# Patient Record
Sex: Male | Born: 1950 | Race: White | Hispanic: No | Marital: Married | Smoking: Former smoker
Health system: Southern US, Community
[De-identification: ages and names within clinical notes are randomized; demographics above are authoritative.]

## PROBLEM LIST (undated history)

## (undated) DIAGNOSIS — F4310 Post-traumatic stress disorder, unspecified: Secondary | ICD-10-CM

## (undated) DIAGNOSIS — G43909 Migraine, unspecified, not intractable, without status migrainosus: Secondary | ICD-10-CM

## (undated) DIAGNOSIS — I639 Cerebral infarction, unspecified: Secondary | ICD-10-CM

## (undated) DIAGNOSIS — G250 Essential tremor: Secondary | ICD-10-CM

## (undated) DIAGNOSIS — G4730 Sleep apnea, unspecified: Secondary | ICD-10-CM

## (undated) DIAGNOSIS — F329 Major depressive disorder, single episode, unspecified: Secondary | ICD-10-CM

## (undated) DIAGNOSIS — E119 Type 2 diabetes mellitus without complications: Secondary | ICD-10-CM

## (undated) DIAGNOSIS — I10 Essential (primary) hypertension: Secondary | ICD-10-CM

## (undated) DIAGNOSIS — I2510 Atherosclerotic heart disease of native coronary artery without angina pectoris: Secondary | ICD-10-CM

## (undated) DIAGNOSIS — F32A Depression, unspecified: Secondary | ICD-10-CM

## (undated) HISTORY — DX: Post-traumatic stress disorder, unspecified: F43.10

## (undated) HISTORY — DX: Atherosclerotic heart disease of native coronary artery without angina pectoris: I25.10

## (undated) HISTORY — DX: Essential (primary) hypertension: I10

## (undated) HISTORY — DX: Sleep apnea, unspecified: G47.30

## (undated) HISTORY — PX: CORONARY ANGIOPLASTY WITH STENT PLACEMENT: SHX49

## (undated) HISTORY — DX: Depression, unspecified: F32.A

## (undated) HISTORY — DX: Migraine, unspecified, not intractable, without status migrainosus: G43.909

## (undated) HISTORY — DX: Cerebral infarction, unspecified: I63.9

## (undated) HISTORY — PX: HERNIA REPAIR: SHX51

## (undated) HISTORY — DX: Essential tremor: G25.0

## (undated) HISTORY — DX: Major depressive disorder, single episode, unspecified: F32.9

---

## 2004-07-16 ENCOUNTER — Emergency Department (HOSPITAL_COMMUNITY): Admission: EM | Admit: 2004-07-16 | Discharge: 2004-07-17 | Payer: Self-pay | Admitting: Emergency Medicine

## 2005-11-13 ENCOUNTER — Other Ambulatory Visit: Payer: Self-pay

## 2005-11-13 ENCOUNTER — Inpatient Hospital Stay: Payer: Self-pay | Admitting: Internal Medicine

## 2005-11-24 ENCOUNTER — Ambulatory Visit: Payer: Self-pay | Admitting: Cardiology

## 2005-11-25 ENCOUNTER — Inpatient Hospital Stay (HOSPITAL_COMMUNITY): Admission: EM | Admit: 2005-11-25 | Discharge: 2005-11-27 | Payer: Self-pay | Admitting: Emergency Medicine

## 2006-03-22 ENCOUNTER — Ambulatory Visit (HOSPITAL_COMMUNITY): Admission: RE | Admit: 2006-03-22 | Discharge: 2006-03-22 | Payer: Self-pay | Admitting: Cardiovascular Disease

## 2006-05-11 ENCOUNTER — Inpatient Hospital Stay (HOSPITAL_COMMUNITY): Admission: EM | Admit: 2006-05-11 | Discharge: 2006-05-13 | Payer: Self-pay | Admitting: *Deleted

## 2009-12-08 ENCOUNTER — Inpatient Hospital Stay: Payer: Self-pay | Admitting: Internal Medicine

## 2015-07-16 ENCOUNTER — Encounter: Payer: Self-pay | Admitting: Emergency Medicine

## 2015-07-16 ENCOUNTER — Emergency Department
Admission: EM | Admit: 2015-07-16 | Discharge: 2015-07-16 | Disposition: A | Discharge status: Home / Self Care | Payer: TRICARE For Life (TFL) | Attending: Emergency Medicine | Admitting: Emergency Medicine

## 2015-07-16 ENCOUNTER — Emergency Department: Payer: TRICARE For Life (TFL)

## 2015-07-16 DIAGNOSIS — R200 Anesthesia of skin: Secondary | ICD-10-CM | POA: Diagnosis present

## 2015-07-16 DIAGNOSIS — Z87891 Personal history of nicotine dependence: Secondary | ICD-10-CM | POA: Diagnosis not present

## 2015-07-16 DIAGNOSIS — E119 Type 2 diabetes mellitus without complications: Secondary | ICD-10-CM | POA: Diagnosis not present

## 2015-07-16 DIAGNOSIS — G459 Transient cerebral ischemic attack, unspecified: Secondary | ICD-10-CM | POA: Diagnosis not present

## 2015-07-16 HISTORY — DX: Type 2 diabetes mellitus without complications: E11.9

## 2015-07-16 LAB — COMPREHENSIVE METABOLIC PANEL
ALK PHOS: 74 U/L (ref 38–126)
ALT: 16 U/L — AB (ref 17–63)
AST: 23 U/L (ref 15–41)
Albumin: 4 g/dL (ref 3.5–5.0)
Anion gap: 6 (ref 5–15)
BILIRUBIN TOTAL: 0.5 mg/dL (ref 0.3–1.2)
BUN: 14 mg/dL (ref 6–20)
CALCIUM: 9.1 mg/dL (ref 8.9–10.3)
CO2: 28 mmol/L (ref 22–32)
CREATININE: 1.32 mg/dL — AB (ref 0.61–1.24)
Chloride: 107 mmol/L (ref 101–111)
GFR calc non Af Amer: 56 mL/min — ABNORMAL LOW (ref >60)
GLUCOSE: 136 mg/dL — AB (ref 65–99)
Potassium: 3.6 mmol/L (ref 3.5–5.1)
SODIUM: 141 mmol/L (ref 135–145)
TOTAL PROTEIN: 7.4 g/dL (ref 6.5–8.1)

## 2015-07-16 LAB — PROTIME-INR
INR: 1.12
PROTHROMBIN TIME: 14.6 s (ref 11.4–15.0)

## 2015-07-16 LAB — CBC
HCT: 41.5 % (ref 40.0–52.0)
Hemoglobin: 13.9 g/dL (ref 13.0–18.0)
MCH: 30.1 pg (ref 26.0–34.0)
MCHC: 33.5 g/dL (ref 32.0–36.0)
MCV: 90 fL (ref 80.0–100.0)
PLATELETS: 168 10*3/uL (ref 150–440)
RBC: 4.61 MIL/uL (ref 4.40–5.90)
RDW: 13.9 % (ref 11.5–14.5)
WBC: 6.3 10*3/uL (ref 3.8–10.6)

## 2015-07-16 LAB — APTT: APTT: 32 s (ref 24–36)

## 2015-07-16 LAB — DIFFERENTIAL
Basophils Absolute: 0 10*3/uL (ref 0–0.1)
Basophils Relative: 1 %
Eosinophils Absolute: 0.2 10*3/uL (ref 0–0.7)
Eosinophils Relative: 3 %
LYMPHS ABS: 1.8 10*3/uL (ref 1.0–3.6)
LYMPHS PCT: 29 %
MONO ABS: 0.3 10*3/uL (ref 0.2–1.0)
Monocytes Relative: 5 %
NEUTROS ABS: 3.9 10*3/uL (ref 1.4–6.5)
Neutrophils Relative %: 62 %

## 2015-07-16 MED ORDER — ASPIRIN 81 MG PO CHEW
324.0000 mg | CHEWABLE_TABLET | Freq: Once | ORAL | Status: AC
  Administered 2015-07-16: 324 mg via ORAL
  Filled 2015-07-16: qty 4

## 2015-07-16 MED ORDER — IOHEXOL 350 MG/ML SOLN
75.0000 mL | Freq: Once | INTRAVENOUS | Status: AC | PRN
  Administered 2015-07-16: 75 mL via INTRAVENOUS

## 2015-07-16 NOTE — ED Notes (Signed)
cbg 129 

## 2015-07-16 NOTE — Discharge Instructions (Signed)
It is very important he follow-up with Dr. Judithann Sheen on Wednesday as planned. Please take a baby aspirin (81 mg) daily in addition to Plavix.  Transient Ischemic Attack A transient ischemic attack (TIA) is a "warning stroke" that causes stroke-like symptoms. Unlike a stroke, a TIA does not cause permanent damage to the brain. The symptoms of a TIA can happen very fast and do not last long. It is important to know the symptoms of a TIA and what to do. This can help prevent a major stroke or death. CAUSES   A TIA is caused by a temporary blockage in an artery in the brain or neck (carotid artery). The blockage does not allow the brain to get the blood supply it needs and can cause different symptoms. The blockage can be caused by either:  A blood clot.  Fatty buildup (plaque) in a neck or brain artery. RISK FACTORS  High blood pressure (hypertension).  High cholesterol.  Diabetes mellitus.  Heart disease.  The build up of plaque in the blood vessels (peripheral artery disease or atherosclerosis).  The build up of plaque in the blood vessels providing blood and oxygen to the brain (carotid artery stenosis).  An abnormal heart rhythm (atrial fibrillation).  Obesity.  Smoking.  Taking oral contraceptives (especially in combination with smoking).  Physical inactivity.  A diet high in fats, salt (sodium), and calories.  Alcohol use.  Use of illegal drugs (especially cocaine and methamphetamine).  Being male.  Being African American.  Being over the age of 66.  Family history of stroke.  Previous history of blood clots, stroke, TIA, or heart attack.  Sickle cell disease. SYMPTOMS  TIA symptoms are the same as a stroke but are temporary. These symptoms usually develop suddenly, or may be newly present upon awakening from sleep:  Sudden weakness or numbness of the face, arm, or leg, especially on one side of the body.  Sudden trouble walking or difficulty moving arms or  legs.  Sudden confusion.  Sudden personality changes.  Trouble speaking (aphasia) or understanding.  Difficulty swallowing.  Sudden trouble seeing in one or both eyes.  Double vision.  Dizziness.  Loss of balance or coordination.  Sudden severe headache with no known cause.  Trouble reading or writing.  Loss of bowel or bladder control.  Loss of consciousness. DIAGNOSIS  Your caregiver may be able to determine the presence or absence of a TIA based on your symptoms, history, and physical exam. Computed tomography (CT scan) of the brain is usually performed to help identify a TIA. Other tests may be done to diagnose a TIA. These tests may include:  Electrocardiography.  Continuous heart monitoring.  Echocardiography.  Carotid ultrasonography.  Magnetic resonance imaging (MRI).  A scan of the brain circulation.  Blood tests. PREVENTION  The risk of a TIA can be decreased by appropriately treating high blood pressure, high cholesterol, diabetes, heart disease, and obesity and by quitting smoking, limiting alcohol, and staying physically active. TREATMENT  Time is of the essence. Since the symptoms of TIA are the same as a stroke, it is important to seek treatment as soon as possible because you may need a medicine to dissolve the clot (thrombolytic) that cannot be given if too much time has passed. Treatment options vary. Treatment options may include rest, oxygen, intravenous (IV) fluids, and medicines to thin the blood (anticoagulants). Medicines and diet may be used to address diabetes, high blood pressure, and other risk factors. Measures will be taken to prevent  short-term and long-term complications, including infection from breathing foreign material into the lungs (aspiration pneumonia), blood clots in the legs, and falls. Treatment options include procedures to either remove plaque in the carotid arteries or dilate carotid arteries that have narrowed due to plaque.  Those procedures are:  Carotid endarterectomy.  Carotid angioplasty and stenting. HOME CARE INSTRUCTIONS   Take all medicines prescribed by your caregiver. Follow the directions carefully. Medicines may be used to control risk factors for a stroke. Be sure you understand all your medicine instructions.  You may be told to take aspirin or the anticoagulant warfarin. Warfarin needs to be taken exactly as instructed.  Taking too much or too little warfarin is dangerous. Too much warfarin increases the risk of bleeding. Too little warfarin continues to allow the risk for blood clots. While taking warfarin, you will need to have regular blood tests to measure your blood clotting time. A PT blood test measures how long it takes for blood to clot. Your PT is used to calculate another value called an INR. Your PT and INR help your caregiver to adjust your dose of warfarin. The dose can change for many reasons. It is critically important that you take warfarin exactly as prescribed.  Many foods, especially foods high in vitamin K can interfere with warfarin and affect the PT and INR. Foods high in vitamin K include spinach, kale, broccoli, cabbage, collard and turnip greens, brussels sprouts, peas, cauliflower, seaweed, and parsley as well as beef and pork liver, green tea, and soybean oil. You should eat a consistent amount of foods high in vitamin K. Avoid major changes in your diet, or notify your caregiver before changing your diet. Arrange a visit with a dietitian to answer your questions.  Many medicines can interfere with warfarin and affect the PT and INR. You must tell your caregiver about any and all medicines you take, this includes all vitamins and supplements. Be especially cautious with aspirin and anti-inflammatory medicines. Do not take or discontinue any prescribed or over-the-counter medicine except on the advice of your caregiver or pharmacist.  Warfarin can have side effects, such as  excessive bruising or bleeding. You will need to hold pressure over cuts for longer than usual. Your caregiver or pharmacist will discuss other potential side effects.  Avoid sports or activities that may cause injury or bleeding.  Be mindful when shaving, flossing your teeth, or handling sharp objects.  Alcohol can change the body's ability to handle warfarin. It is best to avoid alcoholic drinks or consume only very small amounts while taking warfarin. Notify your caregiver if you change your alcohol intake.  Notify your dentist or other caregivers before procedures.  Eat a diet that includes 5 or more servings of fruits and vegetables each day. This may reduce the risk of stroke. Certain diets may be prescribed to address high blood pressure, high cholesterol, diabetes, or obesity.  A low-sodium, low-saturated fat, low-trans fat, low-cholesterol diet is recommended to manage high blood pressure.  A low-saturated fat, low-trans fat, low-cholesterol, and high-fiber diet may control cholesterol levels.  A controlled-carbohydrate, controlled-sugar diet is recommended to manage diabetes.  A reduced-calorie, low-sodium, low-saturated fat, low-trans fat, low-cholesterol diet is recommended to manage obesity.  Maintain a healthy weight.  Stay physically active. It is recommended that you get at least 30 minutes of activity on most or all days.  Do not smoke.  Limit alcohol use even if you are not taking warfarin. Moderate alcohol use is considered to  be:  No more than 2 drinks each day for men.  No more than 1 drink each day for nonpregnant women.  Stop drug abuse.  Home safety. A safe home environment is important to reduce the risk of falls. Your caregiver may arrange for specialists to evaluate your home. Having grab bars in the bedroom and bathroom is often important. Your caregiver may arrange for equipment to be used at home, such as raised toilets and a seat for the  shower.  Follow all instructions for follow-up with your caregiver. This is very important. This includes any referrals and lab tests. Proper follow up can prevent a stroke or another TIA from occurring. SEEK MEDICAL CARE IF:  You have personality changes.  You have difficulty swallowing.  You are seeing double.  You have dizziness.  You have a fever.  You have skin breakdown. SEEK IMMEDIATE MEDICAL CARE IF:  Any of these symptoms may represent a serious problem that is an emergency. Do not wait to see if the symptoms will go away. Get medical help right away. Call your local emergency services (911 in [place] Do not drive yourself to the hospital.  You have sudden weakness or numbness of the face, arm, or leg, especially on one side of the body.  You have sudden trouble walking or difficulty moving arms or legs.  You have sudden confusion.  You have trouble speaking (aphasia) or understanding.  You have sudden trouble seeing in one or both eyes.  You have a loss of balance or coordination.  You have a sudden, severe headache with no known cause.  You have new chest pain or an irregular heartbeat.  You have a partial or total loss of consciousness. MAKE SURE YOU:   Understand these instructions.  Will watch your condition.  Will get help right away if you are not doing well or get worse. Document Released: 08/08/2005 Document Revised: 11/03/2013 Document Reviewed: 02/03/2014 [facility] Patient Information 2015 [place], [place]. This information is not intended to replace advice given to you by your health care provider. Make sure you discuss any questions you have with your health care provider.

## 2015-07-16 NOTE — ED Notes (Signed)
Pt reports right sided facial numbness that started around 1400 today. Pt also reports "itching" sensation to right arm. Pt taken to CT. Family hx of CVA, pt with hx of cigarette smoking.

## 2015-07-16 NOTE — ED Provider Notes (Signed)
[facility] Emergency Department Provider Note REMINDER - THIS NOTE IS NOT A FINAL MEDICAL RECORD UNTIL IT IS SIGNED. UNTIL THEN, THE CONTENT BELOW MAY REFLECT INFORMATION FROM A DOCUMENTATION TEMPLATE, NOT THE ACTUAL PATIENT VISIT. ____________________________________________  Time seen: Approximately 4:03 PM  I have reviewed the triage vital signs and the nursing notes.   HISTORY  Chief Complaint Numbness    HPI Mitchell Kennedy is a 64 y.o. male history of a previous MI as well as diabetes. Patient reports that approximately 2:00 today he began feeling a tingling itching feeling over the right side of his lower face and jaw. He also had a mild left-sided headache. This came briefly,and then went away after 15 minutes. Came back again just before he got to the emergency room, and he now states that his symptoms have gone gone away.  No trouble speaking. No facial droop. No weakness in arm or leg. No numbness or tingling anywhere else except possibly a very brief feeling of a burning sensation in the right arm  No chest pain or trouble breathing.   Past Medical History  Diagnosis Date  . Diabetes mellitus without complication     There are no active problems to display for this patient.   No past surgical history on file.  No current outpatient prescriptions on file.  Allergies Review of patient's allergies indicates no known allergies.  No family history on file.  Social History Social History  Substance Use Topics  . Smoking status: Former Smoker    Types: Cigarettes  . Smokeless tobacco: None  . Alcohol Use: No    Review of Systems Constitutional: No fever/chills Eyes: No visual changes. ENT: No sore throat. Cardiovascular: Denies chest pain. Respiratory: Denies shortness of breath. Gastrointestinal: No abdominal pain.  No nausea, no vomiting.  No diarrhea.  No constipation. Genitourinary: Negative for dysuria. Musculoskeletal:  Negative for back pain. Skin: Negative for rash. Neurological: Negative for headaches, focal weakness.  10-point ROS otherwise negative.  ____________________________________________   PHYSICAL EXAM:  VITAL SIGNS: ED Triage Vitals  Enc Vitals Group     BP 07/16/15 1441 140/78 mmHg     Pulse Rate 07/16/15 1441 55     Resp 07/16/15 1441 16     Temp 07/16/15 1550 98 F (36.7 C)     Temp Source 07/16/15 1557 Oral     SpO2 07/16/15 1441 100 %     Weight 07/16/15 1441 187 lb 4.8 oz (84.959 kg)     Height --      Head Cir --      Peak Flow --      Pain Score 07/16/15 1441 0     Pain Loc --      Pain Edu? --      Excl. in GC? --    Constitutional: Alert and oriented. Well appearing and in no acute distress. Eyes: Conjunctivae are normal. PERRL. EOMI. Head: Atraumatic. Nose: No congestion/rhinnorhea. Mouth/Throat: Mucous membranes are moist.  Oropharynx non-erythematous. Neck: No stridor.   Cardiovascular: Normal rate, regular rhythm. Grossly normal heart sounds.  Good peripheral circulation. Respiratory: Normal respiratory effort.  No retractions. Lungs CTAB. Gastrointestinal: Soft and nontender. No distention. No abdominal bruits. No CVA tenderness. Musculoskeletal: No lower extremity tenderness nor edema.  No joint effusions. Neurologic:  Normal speech and language. No gross focal neurologic deficits are appreciated. No gait instability. I performed a complete NIH stroke scale, and the patient score is 0. No deficits. Skin:  Skin is  warm, dry and intact. No rash noted. Psychiatric: Mood and affect are normal. Speech and behavior are normal.  ____________________________________________   LABS (all labs ordered are listed, but only abnormal results are displayed)  Labs Reviewed  COMPREHENSIVE METABOLIC PANEL - Abnormal; Notable for the following:    Glucose, Bld 136 (*)    Creatinine, Ser 1.32 (*)    ALT 16 (*)    GFR calc non Af Amer 56 (*)    All other components  within normal limits  PROTIME-INR  APTT  CBC  DIFFERENTIAL  CBG MONITORING, ED  I-STAT TROPOININ, ED  I-STAT CHEM 8, ED   ____________________________________________  EKG  ED ECG REPORT I, QUALE, MARK, the attending physician, personally viewed and interpreted this ECG.  Date: 07/16/2015 EKG Time: 1450 Rate: 50 Rhythm: normal sinus rhythm QRS Axis: normal Intervals: normal ST/T Wave abnormalities: normal Conduction Disutrbances: none Narrative Interpretation: Sinus bradycardia without ischemic change  ____________________________________________  RADIOLOGY   IMPRESSION: Negative CT of the head  Negative CTA of the head and neck. ____________________________________________   PROCEDURES  Procedure(s) performed: None  Critical Care performed: Yes, see critical care note(s)  CRITICAL CARE Performed by: Sharyn Creamer   Total critical care time: 35  Critical care time was exclusive of separately billable procedures and treating other patients.  Critical care was necessary to treat or prevent imminent or life-threatening deterioration.  Critical care was time spent personally by me on the following activities: development of treatment plan with patient and/or surrogate as well as nursing, discussions with consultants, evaluation of patient's response to treatment, examination of patient, obtaining history from patient or surrogate, ordering and performing treatments and interventions, ordering and review of laboratory studies, ordering and review of radiographic studies, pulse oximetry and re-evaluation of patient's condition.  ____________________________________________   INITIAL IMPRESSION / ASSESSMENT AND PLAN / ED COURSE  Pertinent labs & imaging results that were available during my care of the patient were reviewed by me and considered in my medical decision making (see chart for details).  Patient presents with sudden onset of numbness over the right  lower face. This has now resolved. Patient's NIH score is 0. His blood sugar was checked and normal. No acute cardiopulmonary symptoms and a reassuring exam at this time. Based on his previous medical history, primary concern is for possible transient ischemic attack. Patient was seen by neuro specialist on-call who advises evaluation for possible TIA. Discussed with the neurologist on-call, given the patient's CTA of the head and neck is normal and the patient strongly wishes to follow up outpatient, and Dr. Judithann Sheen has seen him in the emergency room and advised outpatient follow-up on Wednesday with him in the clinic for further workup we will discharge the patient to continue on a baby aspirin daily along with his Plavix. Careful return precautions advised to patient and his wife. Patient very agreeable.  The patient was offered admission, but given his long history of PTSD and difficulty in concerns about staying in the hospital, and little added benefit noted from hospitalization other than more expedited workup, I am in agreement with Dr. Judithann Sheen that the patient can be discharged home and will follow-up with him Wednesday to have ongoing care and workup.  Close return precautions advised.  Patient remains asymptomatic at 5:10 PM ____________________________________________   FINAL CLINICAL IMPRESSION(S) / ED DIAGNOSES  Final diagnoses:  Transient cerebral ischemia, unspecified transient cerebral ischemia type      Sharyn Creamer, MD 07/16/15 [phone]

## 2015-07-17 LAB — GLUCOSE, CAPILLARY: GLUCOSE-CAPILLARY: 129 mg/dL — AB (ref 65–99)

## 2015-07-21 ENCOUNTER — Other Ambulatory Visit: Payer: Self-pay | Admitting: Internal Medicine

## 2015-07-21 DIAGNOSIS — R200 Anesthesia of skin: Secondary | ICD-10-CM

## 2015-07-26 ENCOUNTER — Ambulatory Visit: Payer: TRICARE For Life (TFL)

## 2015-08-01 ENCOUNTER — Ambulatory Visit
Admission: RE | Admit: 2015-08-01 | Discharge: 2015-08-01 | Disposition: A | Discharge status: Home / Self Care | Payer: TRICARE For Life (TFL) | Source: Ambulatory Visit | Attending: Internal Medicine | Admitting: Internal Medicine

## 2015-08-01 DIAGNOSIS — R200 Anesthesia of skin: Secondary | ICD-10-CM | POA: Insufficient documentation

## 2015-08-10 DIAGNOSIS — E119 Type 2 diabetes mellitus without complications: Secondary | ICD-10-CM | POA: Insufficient documentation

## 2015-08-10 DIAGNOSIS — Z8673 Personal history of transient ischemic attack (TIA), and cerebral infarction without residual deficits: Secondary | ICD-10-CM | POA: Insufficient documentation

## 2015-09-29 DIAGNOSIS — G43719 Chronic migraine without aura, intractable, without status migrainosus: Secondary | ICD-10-CM | POA: Insufficient documentation

## 2015-09-29 DIAGNOSIS — IMO0002 Reserved for concepts with insufficient information to code with codable children: Secondary | ICD-10-CM | POA: Insufficient documentation

## 2016-07-09 ENCOUNTER — Other Ambulatory Visit: Payer: Self-pay | Admitting: Internal Medicine

## 2016-07-09 DIAGNOSIS — R918 Other nonspecific abnormal finding of lung field: Secondary | ICD-10-CM

## 2016-07-17 ENCOUNTER — Ambulatory Visit
Admission: RE | Admit: 2016-07-17 | Discharge: 2016-07-17 | Disposition: A | Discharge status: Home / Self Care | Payer: TRICARE For Life (TFL) | Source: Ambulatory Visit | Attending: Internal Medicine | Admitting: Internal Medicine

## 2016-07-17 DIAGNOSIS — M47894 Other spondylosis, thoracic region: Secondary | ICD-10-CM | POA: Diagnosis not present

## 2016-07-17 DIAGNOSIS — R918 Other nonspecific abnormal finding of lung field: Secondary | ICD-10-CM

## 2016-07-17 DIAGNOSIS — I2510 Atherosclerotic heart disease of native coronary artery without angina pectoris: Secondary | ICD-10-CM | POA: Insufficient documentation

## 2016-07-17 DIAGNOSIS — I700 Atherosclerosis of aorta: Secondary | ICD-10-CM | POA: Diagnosis not present

## 2016-07-18 ENCOUNTER — Other Ambulatory Visit: Payer: Self-pay | Admitting: Internal Medicine

## 2016-07-18 ENCOUNTER — Telehealth: Payer: Self-pay | Admitting: Cardiothoracic Surgery

## 2016-07-18 DIAGNOSIS — R918 Other nonspecific abnormal finding of lung field: Secondary | ICD-10-CM

## 2016-07-18 NOTE — Telephone Encounter (Signed)
[facility]awn [facility] with [facility] Patient  appointment with Dr Thelma Bargeaks. Patient has been scheduled for Friday September 8th at 9:15am He will be having his PFT's done this week prior to his appointment and his PET scan is being scheduled.

## 2016-07-19 ENCOUNTER — Encounter: Payer: Self-pay | Admitting: Cardiothoracic Surgery

## 2016-07-19 ENCOUNTER — Ambulatory Visit: Payer: TRICARE For Life (TFL) | Attending: Internal Medicine

## 2016-07-19 DIAGNOSIS — Z720 Tobacco use: Secondary | ICD-10-CM | POA: Diagnosis not present

## 2016-07-19 DIAGNOSIS — R918 Other nonspecific abnormal finding of lung field: Secondary | ICD-10-CM | POA: Diagnosis not present

## 2016-07-19 DIAGNOSIS — R0609 Other forms of dyspnea: Secondary | ICD-10-CM | POA: Insufficient documentation

## 2016-07-19 LAB — BLOOD GAS, ARTERIAL
Acid-base deficit: 0.7 mmol/L (ref 0.0–2.0)
Bicarbonate: 24.2 mmol/L (ref 20.0–28.0)
FIO2: 0.21
O2 SAT: 92.5 %
PCO2 ART: 40 mmHg (ref 32.0–48.0)
PO2 ART: 66 mmHg — AB (ref 83.0–108.0)
Patient temperature: 37
pH, Arterial: 7.39 (ref 7.350–7.450)

## 2016-07-20 ENCOUNTER — Encounter: Payer: Self-pay | Admitting: Cardiothoracic Surgery

## 2016-07-20 ENCOUNTER — Ambulatory Visit: Payer: TRICARE For Life (TFL)

## 2016-07-20 ENCOUNTER — Ambulatory Visit (INDEPENDENT_AMBULATORY_CARE_PROVIDER_SITE_OTHER): Payer: TRICARE For Life (TFL) | Admitting: Cardiothoracic Surgery

## 2016-07-20 VITALS — BP 157/85 | HR 60 | Temp 98.2°F | Ht 68.0 in | Wt 206.6 lb

## 2016-07-20 DIAGNOSIS — R911 Solitary pulmonary nodule: Secondary | ICD-10-CM

## 2016-07-20 DIAGNOSIS — F329 Major depressive disorder, single episode, unspecified: Secondary | ICD-10-CM | POA: Insufficient documentation

## 2016-07-20 DIAGNOSIS — F32A Depression, unspecified: Secondary | ICD-10-CM | POA: Insufficient documentation

## 2016-07-20 DIAGNOSIS — F4310 Post-traumatic stress disorder, unspecified: Secondary | ICD-10-CM | POA: Insufficient documentation

## 2016-07-20 DIAGNOSIS — I10 Essential (primary) hypertension: Secondary | ICD-10-CM | POA: Insufficient documentation

## 2016-07-20 DIAGNOSIS — I2510 Atherosclerotic heart disease of native coronary artery without angina pectoris: Secondary | ICD-10-CM | POA: Insufficient documentation

## 2016-07-20 NOTE — Progress Notes (Signed)
Patient ID: Mitchell Kennedy Thuman, male   DOB: May 28, 1951, 65 y.o.   MRN: [id number]  Chief Complaint  Patient presents with  . New Patient (Initial Visit)    Lung mass    Referred By Dr. Judithann SheenSparks Reason for Referral RUL mass  HPI Location, Quality, Duration, Severity, Timing, Context, Modifying Factors, Associated Signs and Symptoms.  Mitchell Kennedy Po is a 65 y.o. male.  He was in his usual state of health when he presented to Dr. Aram BeechamJeffrey Sparks for routine physical exam. At the time he is complaining mostly of fatigue but had no pulmonary symptoms and a chest x-ray was obtained. The chest x-ray was abnormal and this led to a subsequent CT scan. I have independently reviewed the CT scan shows multiple bilateral pulmonary nodules. The patient states that he has no pulmonary symptoms except he does get somewhat short of breath with exertion. This is been relatively new for him but he has been more sedentary over the last several months. He has a long-standing history of smoking and quit about a year ago. He currently uses E cigarettes. He states that he has had no cough, fever, chills, hemoptysis, weight loss. He's actually gained some weight over the last years he's become more sedentary. His appetite is been good. He does have a family history of lung cancer in an uncle. He has no known asbestos exposure.   Past Medical History:  Diagnosis Date  . ASCVD (arteriosclerotic cardiovascular disease)   . Depression   . Diabetes mellitus without complication (HCC)   . Hypertension   . Migraines   . PTSD (post-traumatic stress disorder)   . Sleep apnea   . Stroke [facility](HCC)     Past Surgical History:  Procedure Laterality Date  . CORONARY ANGIOPLASTY WITH STENT PLACEMENT    . HERNIA REPAIR      Family History  Problem Relation Age of Onset  . Heart disease Father   . Cervical cancer Maternal Aunt   . Lung cancer Maternal Uncle   . Cancer Maternal Uncle   . Cancer - Other Maternal Uncle      Social History Social History  Substance Use Topics  . Smoking status: Former Smoker    Packs/day: 1.00    Years: 50.00    Types: Cigarettes  . Smokeless tobacco: Never Used  . Alcohol use No    No Known Allergies  Current Outpatient Prescriptions  Medication Sig Dispense Refill  . amitriptyline (ELAVIL) 25 MG tablet Take 1 tablet by mouth at bedtime.    . citalopram (CELEXA) 40 MG tablet Take 1 tablet by mouth 1 day or 1 dose.    . clopidogrel (PLAVIX) 75 MG tablet Take 1 tablet by mouth 1 day or 1 dose.    . isosorbide dinitrate (ISORDIL) 30 MG tablet Take 1 tablet by mouth 1 day or 1 dose.    . pantoprazole (PROTONIX) 40 MG tablet Take 1 tablet by mouth 1 day or 1 dose.    Marland Kitchen. aspirin EC 81 MG tablet Take 1 tablet by mouth 1 day or 1 dose.     No current facility-administered medications for this visit.       Review of Systems A complete review of systems was asked and was negative except for the following positive findingsShortness of breath, joint pain, headaches, posttraumatic stress disorder, excessive urination.  Blood pressure (!) 157/85, pulse 60, temperature 98.2 F (36.8 C), temperature source Oral, height 5\' 8"  (1.727 m), weight 206 lb 9.6 oz (  93.7 kg).  Physical Exam CONSTITUTIONAL:  Pleasant, well-developed, well-nourished, and in no acute distress. EYES: Pupils equal and reactive to light, Sclera non-icteric EARS, NOSE, MOUTH AND THROAT:  The oropharynx was clear.  Dentition is good repair.  Oral mucosa pink and moist. LYMPH NODES:  Lymph nodes in the neck and axillae were normal RESPIRATORY:  Lungs were clear.  Normal respiratory effort without pathologic use of accessory muscles of respiration CARDIOVASCULAR: Heart was regular without murmurs.  There were no carotid bruits. GI: The abdomen was soft, nontender, and nondistended. There were no palpable masses. There was no hepatosplenomegaly. There were normal bowel sounds in all quadrants. GU:  Rectal  deferred.   MUSCULOSKELETAL:  Normal muscle strength and tone.  No clubbing or cyanosis.   SKIN:  There were no pathologic skin lesions.  There were no nodules on palpation. NEUROLOGIC:  Sensation is normal.  Cranial nerves are grossly intact. PSYCH:  Oriented to person, place and time.  Mood and affect are normal.  Data Reviewed CT scan  I have personally reviewed the patient's imaging, laboratory findings and medical records.    Assessment    I have independently reviewed the patient's CT scan. There are multiple bilateral pulmonary nodules. The presence of these nodules makes a inflammatory or infectious etiology more likely. I told the patient I thought a be a reasonable approach to prescribe a short course of antibiotic therapy and a follow-up CT scan the chest within 4 weeks. He is agreeable to doing so and will come back to see me. He understands that this may represent a malignancy and that a biopsy or surgical therapy may be indicated in the future.    Plan    Amoxicillin 500 mg by mouth every 8 hours for 10 days. Repeat CT scan in 2 weeks after completion of antibiotic therapy. Follow-up at that time with me.      Hulda Marin, MD 07/20/2016, 9:44 AM

## 2016-07-20 NOTE — Patient Instructions (Addendum)
We have scheduled you a CT scan for 08/17/16 @ 10:45 at [facility]. We have scheduled you a follow up appointment to discuss CT results. Please finish all of your Antibiotic. If you have any questions call our office.

## 2016-07-24 ENCOUNTER — Ambulatory Visit: Payer: TRICARE For Life (TFL)

## 2016-08-17 ENCOUNTER — Ambulatory Visit
Admission: RE | Admit: 2016-08-17 | Discharge: 2016-08-17 | Disposition: A | Discharge status: Home / Self Care | Payer: TRICARE For Life (TFL) | Source: Ambulatory Visit | Attending: Cardiothoracic Surgery | Admitting: Cardiothoracic Surgery

## 2016-08-17 DIAGNOSIS — I2510 Atherosclerotic heart disease of native coronary artery without angina pectoris: Secondary | ICD-10-CM | POA: Diagnosis not present

## 2016-08-17 DIAGNOSIS — R911 Solitary pulmonary nodule: Secondary | ICD-10-CM | POA: Diagnosis present

## 2016-08-17 DIAGNOSIS — J439 Emphysema, unspecified: Secondary | ICD-10-CM | POA: Insufficient documentation

## 2016-08-21 ENCOUNTER — Ambulatory Visit: Payer: Self-pay | Admitting: Cardiothoracic Surgery

## 2016-08-24 ENCOUNTER — Ambulatory Visit (INDEPENDENT_AMBULATORY_CARE_PROVIDER_SITE_OTHER): Payer: TRICARE For Life (TFL) | Admitting: Cardiothoracic Surgery

## 2016-08-24 ENCOUNTER — Encounter: Payer: Self-pay | Admitting: Cardiothoracic Surgery

## 2016-08-24 ENCOUNTER — Telehealth: Payer: Self-pay

## 2016-08-24 ENCOUNTER — Ambulatory Visit: Payer: Self-pay | Admitting: Cardiothoracic Surgery

## 2016-08-24 VITALS — BP 161/95 | HR 66 | Temp 97.4°F | Ht 68.0 in | Wt 211.6 lb

## 2016-08-24 DIAGNOSIS — R918 Other nonspecific abnormal finding of lung field: Secondary | ICD-10-CM

## 2016-08-24 NOTE — Progress Notes (Signed)
  Patient ID: Mitchell GamblesClarence W Runkel, male   DOB: 10-21-1951, 65 y.o.   MRN: [id number]  HISTORY: He returns today in follow-up. He states he continues to have a cough which is made worse with the use of his e-cigarettes.  He denied any fevers, chills, hemoptysis. He has baseline shortness of breath which is unchanged since he was last seen.   Vitals:   08/24/16 0800  BP: (!) 161/95  Pulse: 66  Temp: 97.4 F (36.3 C)     EXAM:    Resp: Lungs are clear bilaterally.  No respiratory distress, normal effort. Heart:  Regular without murmurs Abd:  Abdomen is soft, non distended and non tender. No masses are palpable.  There is no rebound and no guarding.  Neurological: Alert and oriented to person, place, and time. Coordination normal.  Skin: Skin is warm and dry. No rash noted. No diaphoretic. No erythema. No pallor.  Psychiatric: Normal mood and affect. Normal behavior. Judgment and thought content normal.    ASSESSMENT: He did have a chest CT scan made last week. Of independently reviewed that in compared it to his prior scan. There are 2 areas in the right upper lobe that we were concerned about. Both of these have shown some regression since his prior CT. This would imply a nonneoplastic etiology.   PLAN:   I would like to see the patient back again in 3 months time with a noncontrast chest CT. I explained to him that he will need diligent follow-up for these lung abnormalities. He understands and would like to proceed.    Hulda Marinimothy Najeeb Uptain, MD

## 2016-08-24 NOTE — Telephone Encounter (Signed)
Patient notified of appointments listed below. Ct scan-11/23/16 @ 8am. Dr.Oaks F/U 11/23/16 @ 9am.Patient verbalized understanding of the appointments listed above.

## 2016-08-24 NOTE — Patient Instructions (Signed)
We will call you with an appointment to have your Ct scan done and a follow up appointment to see Dr.Oaks the same day. Please call our office if you have any questions or concerns.

## 2016-11-22 ENCOUNTER — Ambulatory Visit: Payer: TRICARE For Life (TFL)

## 2016-11-23 ENCOUNTER — Ambulatory Visit

## 2016-11-23 ENCOUNTER — Ambulatory Visit: Payer: Self-pay | Admitting: Cardiothoracic Surgery

## 2016-11-23 ENCOUNTER — Ambulatory Visit: Payer: TRICARE For Life (TFL)

## 2016-12-03 ENCOUNTER — Ambulatory Visit

## 2016-12-07 ENCOUNTER — Ambulatory Visit: Payer: Self-pay | Admitting: Cardiothoracic Surgery

## 2016-12-10 ENCOUNTER — Ambulatory Visit
Admission: RE | Admit: 2016-12-10 | Discharge: 2016-12-10 | Disposition: A | Discharge status: Home / Self Care | Source: Ambulatory Visit | Attending: Cardiothoracic Surgery | Admitting: Cardiothoracic Surgery

## 2016-12-10 DIAGNOSIS — I2510 Atherosclerotic heart disease of native coronary artery without angina pectoris: Secondary | ICD-10-CM | POA: Insufficient documentation

## 2016-12-10 DIAGNOSIS — R918 Other nonspecific abnormal finding of lung field: Secondary | ICD-10-CM | POA: Insufficient documentation

## 2016-12-10 DIAGNOSIS — I700 Atherosclerosis of aorta: Secondary | ICD-10-CM | POA: Diagnosis not present

## 2016-12-10 DIAGNOSIS — J439 Emphysema, unspecified: Secondary | ICD-10-CM | POA: Diagnosis not present

## 2016-12-14 ENCOUNTER — Encounter: Payer: Self-pay | Admitting: Cardiothoracic Surgery

## 2016-12-14 ENCOUNTER — Ambulatory Visit (INDEPENDENT_AMBULATORY_CARE_PROVIDER_SITE_OTHER): Admitting: Cardiothoracic Surgery

## 2016-12-14 VITALS — BP 165/81 | HR 66 | Temp 98.1°F | Wt 212.0 lb

## 2016-12-14 DIAGNOSIS — R911 Solitary pulmonary nodule: Secondary | ICD-10-CM

## 2016-12-14 NOTE — Telephone Encounter (Signed)
error 

## 2016-12-14 NOTE — Progress Notes (Signed)
  Patient ID: Mitchell Kennedy, male   DOB: [birth year], 66 y.o.   MRN: [id number]  HISTORY: This patient returns today in follow-up. He was seen last October where a CT scan has shown multiple bilateral pulmonary nodules or infiltrates. He states that he has stopped smoking but he does use an electronic cigarette. He does get short of breath with activities. Depending upon the amount of exertion and that day he gets more or less short of breath.   Vitals:   12/14/16 0807  BP: (!) 165/81  Pulse: 66  Temp: 98.1 F (36.7 C)     EXAM:    Resp: Lungs are clear bilaterally.  No respiratory distress, normal effort. Heart:  Regular without murmurs Abd:  Abdomen is soft, non distended and non tender. No masses are palpable.  There is no rebound and no guarding.  Neurological: Alert and oriented to person, place, and time. Coordination normal.  Skin: Skin is warm and dry. No rash noted. No diaphoretic. No erythema. No pallor.  Psychiatric: Normal mood and affect. Normal behavior. Judgment and thought content normal.    ASSESSMENT: He did have a chest CT scan made earlier this week. I've independently reviewed that. I see no change in the multiple bilateral pulmonary nodules or infiltrates.   PLAN:   I did counsel him in the use of electronic cigarettes. The safety of these have yet to be established. We would also like to see him back again in one year with another CT scan the chest without contrast. He is agreeable to doing so.    Hulda Marinimothy Macalister Arnaud, MD

## 2017-05-17 ENCOUNTER — Ambulatory Visit (INDEPENDENT_AMBULATORY_CARE_PROVIDER_SITE_OTHER): Admitting: Cardiovascular Disease

## 2017-05-17 ENCOUNTER — Encounter: Payer: Self-pay | Admitting: Cardiovascular Disease

## 2017-05-17 VITALS — BP 131/77 | HR 50 | Ht 68.0 in | Wt 205.2 lb

## 2017-05-17 DIAGNOSIS — R079 Chest pain, unspecified: Secondary | ICD-10-CM

## 2017-05-17 DIAGNOSIS — I10 Essential (primary) hypertension: Secondary | ICD-10-CM

## 2017-05-17 DIAGNOSIS — E119 Type 2 diabetes mellitus without complications: Secondary | ICD-10-CM | POA: Diagnosis not present

## 2017-05-17 DIAGNOSIS — R001 Bradycardia, unspecified: Secondary | ICD-10-CM

## 2017-05-17 DIAGNOSIS — I2510 Atherosclerotic heart disease of native coronary artery without angina pectoris: Secondary | ICD-10-CM

## 2017-05-17 DIAGNOSIS — Z8673 Personal history of transient ischemic attack (TIA), and cerebral infarction without residual deficits: Secondary | ICD-10-CM

## 2017-05-17 DIAGNOSIS — I25118 Atherosclerotic heart disease of native coronary artery with other forms of angina pectoris: Secondary | ICD-10-CM | POA: Diagnosis not present

## 2017-05-17 DIAGNOSIS — F4310 Post-traumatic stress disorder, unspecified: Secondary | ICD-10-CM | POA: Diagnosis not present

## 2017-05-17 MED ORDER — ROSUVASTATIN CALCIUM 20 MG PO TABS: 20.0000 mg | ORAL_TABLET | Freq: Every day | ORAL | 3 refills | Status: DC

## 2017-05-17 NOTE — Patient Instructions (Addendum)
Medication Instructions:   Please start the crestor 1/2 pill for a few weeks then up to a full  Labwork:  No new labs needed  Testing/Procedures:  We will order a lexiscan myoview for chest pain, CAD No e-cig for 24 hours before No food morning of the test No caffeine for 24 hours before  [facility] MYOVIEW  Your caregiver has ordered a Stress Test with nuclear imaging. The purpose of this test is to evaluate the blood supply to your heart muscle. This procedure is referred to as a "Non-Invasive Stress Test." This is because other than having an IV started in your vein, nothing is inserted or "invades" your body. Cardiac stress tests are done to find areas of poor blood flow to the heart by determining the extent of coronary artery disease (CAD). Some patients exercise on a treadmill, which naturally increases the blood flow to your heart, while others who are  unable to walk on a treadmill due to physical limitations have a pharmacologic/chemical stress agent called Lexiscan . This medicine will mimic walking on a treadmill by temporarily increasing your coronary blood flow.   Please note: these test may take anywhere between 2-4 hours to complete  PLEASE REPORT TO [facility]  THE VOLUNTEERS AT THE FIRST DESK WILL DIRECT YOU WHERE TO GO  Date of Procedure:___Tuesday, July 10________  Arrival Time for Procedure:_____7:15 am_______   How to prepare for your Myoview test:  1. Do not eat or drink after midnight 2. No caffeine for 24 hours prior to test 3. No smoking 24 hours prior to test. 4. Your medication may be taken with water.  If your doctor stopped a medication because of this test, do not take that medication. 5. Please wear a short sleeve shirt. 6. No perfume, cologne or lotion.  Follow-Up: It was a pleasure seeing you in the office today. Please call us if you have new issues that need to be addressed before your next appt.  [phone]  Your physician  wants you to follow-up in:  As needed  If you need a refill on your cardiac medications before your next appointment, please call your pharmacy.    Cardiac Nuclear Scan A cardiac nuclear scan is a test that measures blood flow to the heart when a person is resting and when he or she is exercising. The test looks for problems such as:  Not enough blood reaching a portion of the heart.  The heart muscle not working normally.  You may need this test if:  You have heart disease.  You have had abnormal lab results.  You have had heart surgery or angioplasty.  You have chest pain.  You have shortness of breath.  In this test, a radioactive dye (tracer) is injected into your bloodstream. After the tracer has traveled to your heart, an imaging device is used to measure how much of the tracer is absorbed by or distributed to various areas of your heart. This procedure is usually done at a hospital and takes 2-4 hours. Tell a health care provider about:  Any allergies you have.  All medicines you are taking, including vitamins, herbs, eye drops, creams, and over-the-counter medicines.  Any problems you or family members have had with the use of anesthetic medicines.  Any blood disorders you have.  Any surgeries you have had.  Any medical conditions you have.  Whether you are pregnant or may be pregnant. What are the risks? Generally, this is a safe procedure.  However, problems may occur, including:  Serious chest pain and heart attack. This is only a risk if the stress portion of the test is done.  Rapid heartbeat.  Sensation of warmth in your chest. This usually passes quickly.  What happens before the procedure?  Ask your health care provider about changing or stopping your regular medicines. This is especially important if you are taking diabetes medicines or blood thinners.  Remove your jewelry on the day of the procedure. What happens during the procedure?  An IV  tube will be inserted into one of your veins.  Your health care provider will inject a small amount of radioactive tracer through the tube.  You will wait for 20-40 minutes while the tracer travels through your bloodstream.  Your heart activity will be monitored with an electrocardiogram (ECG).  You will lie down on an exam table.  Images of your heart will be taken for about 15-20 minutes.  You may be asked to exercise on a treadmill or stationary bike. While you exercise, your heart's activity will be monitored with an ECG, and your blood pressure will be checked. If you are unable to exercise, you may be given a medicine to increase blood flow to parts of your heart.  When blood flow to your heart has peaked, a tracer will again be injected through the IV tube.  After 20-40 minutes, you will get back on the exam table and have more images taken of your heart.  When the procedure is over, your IV tube will be removed. The procedure may vary among health care providers and hospitals. Depending on the type of tracer used, scans may need to be repeated 3-4 hours later. What happens after the procedure?  Unless your health care provider tells you otherwise, you may return to your normal schedule, including diet, activities, and medicines.  Unless your health care provider tells you otherwise, you may increase your fluid intake. This will help flush the contrast dye from your body. Drink enough fluid to keep your urine clear or pale yellow.  It is up to you to get your test results. Ask your health care provider, or the department that is doing the test, when your results will be ready. Summary  A cardiac nuclear scan measures the blood flow to the heart when a person is resting and when he or she is exercising.  You may need this test if you are at risk for heart disease.  Tell your health care provider if you are pregnant.  Unless your health care provider tells you otherwise,  increase your fluid intake. This will help flush the contrast dye from your body. Drink enough fluid to keep your urine clear or pale yellow. This information is not intended to replace advice given to you by your health care provider. Make sure you discuss any questions you have with your health care provider. Document Released: 11/23/2004 Document Revised: 10/31/2016 Document Reviewed: 10/07/2013 Elsevier Interactive Patient Education  2017 [employer].

## 2017-05-17 NOTE — Progress Notes (Signed)
Cardiology Office Note  Date:  05/17/2017   ID:  Mitchell Kennedy, DOB 20-Jul-1951, MRN [id number]  PCP:  Marguarite Arbour, MD   Chief Complaint  Patient presents with  . other    CP with exertion and right leg pain. Meds reviewed verbally with pt.    HPI:  66 year old gentleman with history of Coronary artery disease, previous PCI in [place] (details unavailable), 2012 PTSD, road rage, anxiety,  Depression Diabetes, HBA1C 7.8 Smoker, quit 5 years ago Hyperlipidemia, untreated Chronic bradycardia, asymptomatic Who presents by referral from Dr. Judithann Sheen for consultation of his chest pain symptoms  He reports that over the past several years he has had episodes of chest pain These seem to percent more with stress, anxiety, PTSD episodes Less with exertion such as walking  He is not very active at baseline, no regular exercise program He does not remember the details of his previous catheterization Notes indicating this was 2012  Records have been requested  He is not on a cholesterol medication, never tried one in the past Lab work from primary care reviewed with him in detail  Total chol 202, LDL 111  EKG personally reviewed by myself on todays visit Shows sinus bradycardia rate 50 bpm no significant ST or T-wave changes  CT scan chest January 2018 reviewed indicating advanced coronary artery calcifications for age, atherosclerotic calcifications in the aorta  PMH:   has a past medical history of ASCVD (arteriosclerotic cardiovascular disease); Depression; Diabetes mellitus without complication (HCC); Hypertension; Migraines; PTSD (post-traumatic stress disorder); Sleep apnea; and Stroke (HCC).  PSH:    Past Surgical History:  Procedure Laterality Date  . CORONARY ANGIOPLASTY WITH STENT PLACEMENT     x 2 stent  . HERNIA REPAIR      Current Outpatient Prescriptions  Medication Sig Dispense Refill  . ALPRAZolam (XANAX) 0.25 MG tablet Take 0.25 mg by mouth at bedtime  as needed for anxiety.    Marland Kitchen aspirin EC 81 MG tablet Take 1 tablet by mouth 1 day or 1 dose.    . citalopram (CELEXA) 40 MG tablet Take 1 tablet by mouth 1 day or 1 dose.    . clopidogrel (PLAVIX) 75 MG tablet Take 1 tablet by mouth 1 day or 1 dose.    . pantoprazole (PROTONIX) 40 MG tablet Take 1 tablet by mouth 1 day or 1 dose.    . rosuvastatin (CRESTOR) 20 MG tablet Take 1 tablet (20 mg total) by mouth daily. 90 tablet 3   No current facility-administered medications for this visit.      Allergies:   Patient has no known allergies.   Social History:  The patient  reports that he has quit smoking. His smoking use included Cigarettes. He has a 50.00 pack-year smoking history. He has never used smokeless tobacco. He reports that he does not drink alcohol or use drugs.   Family History:   family history includes Cancer in his maternal uncle; Cancer - Other in his maternal uncle; Cervical cancer in his maternal aunt; Heart disease in his father; Heart failure in his father; Lung cancer in his maternal uncle.    Review of Systems: Review of Systems  Constitutional: Negative.   Respiratory: Negative.   Cardiovascular: Positive for chest pain.  Gastrointestinal: Negative.   Musculoskeletal: Negative.   Neurological: Negative.   Psychiatric/Behavioral: The patient is nervous/anxious.   All other systems reviewed and are negative.    PHYSICAL EXAM: VS:  BP 131/77 (BP Location: Left Arm, Patient  Position: Sitting, Cuff Size: Normal)   Pulse (!) 50   Ht 5\' 8"  (1.727 m)   Wt 205 lb 4 oz (93.1 kg)   BMI 31.21 kg/m  , BMI Body mass index is 31.21 kg/m. GEN: Well nourished, well developed, in no acute distress , Obese  HEENT: normal  Neck: no JVD, carotid bruits, or masses Cardiac: RRR; no murmurs, rubs, or gallops,no edema  Respiratory:  clear to auscultation bilaterally, normal work of breathing GI: soft, nontender, nondistended, + BS MS: no deformity or atrophy  Skin: warm and  dry, no rash Neuro:  Strength and sensation are intact Psych: euthymic mood, full affect    Recent Labs: No results found for requested labs within last 8760 hours.    Lipid Panel No results found for: CHOL, HDL, LDLCALC, TRIG    Wt Readings from Last 3 Encounters:  05/17/17 205 lb 4 oz (93.1 kg)  12/14/16 212 lb (96.2 kg)  08/24/16 211 lb 9.6 oz (96 kg)       ASSESSMENT AND PLAN:  ASCVD (arteriosclerotic cardiovascular disease) -  Diffuse atherosclerosis seen on CT scan Stressed importance of aggressive cholesterol control Long history of smoking, diabetes  Benign hypertension - Plan: EKG 12-Lead Blood pressure is well controlled on today's visit. No changes made to the medications.  Hyperlipidemia CT scan documenting diffuse coronary and aortic disease Cholesterol not at goal Recommended he start Crestor 10 mg daily for one month then up to 20 mg daily Call LDL less than 70  Chest pain, unspecified type -  Plan as below, will schedule pharmacologic stress test Recommended if symptoms get worse that he call our office or go to the emergency room  Coronary artery disease of native artery of native heart with stable angina pectoris (HCC) -  Chest pain presenting with stress, anxiety, when angry Less associated with exertion though he reports it is similar to previous anginal symptoms prior to stenting. Long discussion with him about various treatment options and workup available He does not feel that he can treadmill given her arthritic issues We have ordered a pharmacologic Myoview to rule out ischemia We will start statin as detailed  Diabetes mellitus without complication (HCC) - Plan: NM Myocar Multi W/Spect W/Wall Motion / EF We have stressed importance of aggressive diabetes control He was under the impression sugars were elevated only once but were not a problem  PTSD (post-traumatic stress disorder) Wife reports long history of PTSD, anxiety, depression,  anger issues, road rage  Bradycardia Asymptomatic bradycardia, rate of 50 Indicated to patient and his wife there is no indication for pacemaker as he is hemodynamically stable with no symptoms   Total encounter time more than 60 minutes  Greater than 50% was spent in counseling and coordination of care with the patient   Disposition:   F/U  depending on the results of his stress test We will call him with the results of his stress test Patient seen in consultation for Dr. Judithann SheenSparks and will be referred back to his office for ongoing care of the issues detailed above   Orders Placed This Encounter  Procedures  . NM Myocar Multi W/Spect W/Wall Motion / EF  . EKG 12-Lead     Signed, Dossie Arbourim Sujata Maines, M.D., Ph.D. 05/17/2017  [facility] [place] [phone]

## 2017-05-21 ENCOUNTER — Encounter

## 2017-06-19 ENCOUNTER — Telehealth: Payer: Self-pay | Admitting: Cardiovascular Disease

## 2017-06-19 NOTE — Telephone Encounter (Signed)
Cancelled nm study in July hasnt r/s yet. Patient in schedule WQ

## 2017-06-20 NOTE — Telephone Encounter (Signed)
Left voicemail message to call back  

## 2017-06-21 NOTE — Telephone Encounter (Signed)
Left voicemail message to call back about scheduling stress test.

## 2017-07-10 NOTE — Telephone Encounter (Signed)
Spoke w/ pt.  [facility]. Instructions reviewed - he verbalizes understanding and will arrive @ 9:15.

## 2017-07-17 ENCOUNTER — Encounter

## 2017-07-17 ENCOUNTER — Telehealth: Payer: Self-pay | Admitting: Cardiovascular Disease

## 2017-07-17 NOTE — Telephone Encounter (Signed)
Pt wife calling stating pt called her stating he is not able to make the Myoview this morning due to him not being able to leave work  Would like to reschedule  Please advise

## 2017-07-17 NOTE — Telephone Encounter (Signed)
Returned call to wife, ok per DPR. Patient's job would not let him off work this morning. Rescheduled lexiscan for 07/24/17, arrival of 07:15 am. Reviewed pre-procedural instructions and she verbalized understanding.

## 2017-07-24 ENCOUNTER — Encounter: Admission: RE | Admit: 2017-07-24 | Source: Ambulatory Visit

## 2017-10-18 ENCOUNTER — Telehealth: Payer: Self-pay

## 2017-10-18 DIAGNOSIS — R911 Solitary pulmonary nodule: Secondary | ICD-10-CM

## 2017-10-18 NOTE — Telephone Encounter (Signed)
Called patient to let him know that Dr. Thelma Bargeaks wanted me to schedule his CT Scan and follow up appointment. However, patient stated that he will not schedule anything at this time since his insurance is not squared away. Patient wanted me to contact him on the middle of the month of January 2019 and ask him if his insurance is straighten to schedule his CT and follow Up appointment. I told him that I would call him back.

## 2017-10-29 ENCOUNTER — Ambulatory Visit

## 2017-11-22 ENCOUNTER — Ambulatory Visit: Admitting: Cardiothoracic Surgery

## 2017-12-04 ENCOUNTER — Telehealth: Payer: Self-pay

## 2017-12-04 DIAGNOSIS — R911 Solitary pulmonary nodule: Secondary | ICD-10-CM

## 2017-12-04 NOTE — Telephone Encounter (Signed)
Contacted patient to remind him that he was due to have his one year lung check up. Patient agreed on having the CT Scan and follow up appointment with Dr. Thelma Bargeaks. However, patient requested to have his CT and appointment to be done on the week of 3/11-15/2019. I told him that it was possible. I then asked patient if he wanted me to call him with appointment date and time or if he wanted me to mail him the information. Patient preferred for me to mail it. I told him that I would. This will be set-up today. Please see appointments.

## 2018-01-21 ENCOUNTER — Ambulatory Visit
Admission: RE | Admit: 2018-01-21 | Discharge: 2018-01-21 | Disposition: A | Discharge status: Home / Self Care | Payer: Medicare Other | Source: Ambulatory Visit | Attending: Cardiothoracic Surgery | Admitting: Cardiothoracic Surgery

## 2018-01-21 DIAGNOSIS — J439 Emphysema, unspecified: Secondary | ICD-10-CM | POA: Insufficient documentation

## 2018-01-21 DIAGNOSIS — R911 Solitary pulmonary nodule: Secondary | ICD-10-CM | POA: Diagnosis not present

## 2018-01-21 DIAGNOSIS — I700 Atherosclerosis of aorta: Secondary | ICD-10-CM | POA: Diagnosis not present

## 2018-01-24 ENCOUNTER — Ambulatory Visit (INDEPENDENT_AMBULATORY_CARE_PROVIDER_SITE_OTHER): Payer: Medicare Other | Admitting: Cardiothoracic Surgery

## 2018-01-24 ENCOUNTER — Encounter: Payer: Self-pay | Admitting: Cardiothoracic Surgery

## 2018-01-24 VITALS — BP 163/75 | HR 55 | Temp 97.9°F | Resp 18 | Ht 68.0 in | Wt 197.8 lb

## 2018-01-24 DIAGNOSIS — R911 Solitary pulmonary nodule: Secondary | ICD-10-CM

## 2018-01-24 NOTE — Progress Notes (Signed)
  Patient ID: Mitchell GamblesClarence W Dizdarevic, male   DOB: [birth year], 67 y.o.   MRN: [id number]  HISTORY: This patient returns today in follow-up.  He no longer smokes cigarettes but he does use e-cigarettes.  He states that he has had only one episode of significant shortness of breath and that was when his CPAP machine fell to work properly.  Otherwise he has been getting along pretty well.   Vitals:   01/24/18 0912  BP: (!) 163/75  Pulse: (!) 55  Resp: 18  Temp: 97.9 F (36.6 C)  SpO2: 98%     EXAM:    Resp: Lungs are clear bilaterally.  No respiratory distress, normal effort. Heart:  Regular without murmurs Abd:  Abdomen is soft, non distended and non tender. No masses are palpable.  There is no rebound and no guarding.  Neurological: Alert and oriented to person, place, and time. Coordination normal.  Skin: Skin is warm and dry. No rash noted. No diaphoretic. No erythema. No pallor.  Psychiatric: Normal mood and affect. Normal behavior. Judgment and thought content normal.    ASSESSMENT: I have independently reviewed the patient's CT scan.  There are scattered bilateral pulmonary opacities which are unchanged.  The previous scan from a year ago was also reviewed.  I do not see any significant changes between the 2 exams.   PLAN:   I did counsel the patient regarding his cessation of E cigarettes.  We discussed the potential harmful effects that may have.  I also told him that he may be a candidate for lung cancer screening CTs in the future and that he should discuss this with his primary care physician Dr. Judithann SheenSparks.  At the present time I did not see anything on his CT scan that would require surgical follow-up.  I did not make a return visit for him but would be happy to see him should the need arise.    Hulda Marinimothy Harwood Nall, MD

## 2018-01-24 NOTE — Patient Instructions (Signed)
Please call our office for any questions or concerns you have.

## 2018-06-16 ENCOUNTER — Other Ambulatory Visit: Payer: Self-pay | Admitting: Internal Medicine

## 2018-06-16 DIAGNOSIS — G4452 New daily persistent headache (NDPH): Secondary | ICD-10-CM

## 2018-06-30 ENCOUNTER — Other Ambulatory Visit: Payer: Self-pay | Admitting: Internal Medicine

## 2018-09-12 ENCOUNTER — Encounter: Payer: Self-pay | Admitting: Cardiovascular Disease

## 2018-11-09 IMAGING — CT CT CHEST W/O CM
1 series · 15 of 34 positions shown, 19 images · non-contrast
Comparison: Chest CTs 07/17/2016 and 08/17/2016

CLINICAL DATA: Followup pulmonary lesions. Chronic cough and
shortness of breath. Current smoker.

EXAM:
CT CHEST WITHOUT CONTRAST
TECHNIQUE: Multidetector CT imaging of the chest was performed following the
standard protocol without IV contrast.

[Series 2: thorax · axial · 0.79mm/px · z∈[-624,-348]mm · 15 of 162 slices shown, 19 images]
[im 12/162  mediastinal]
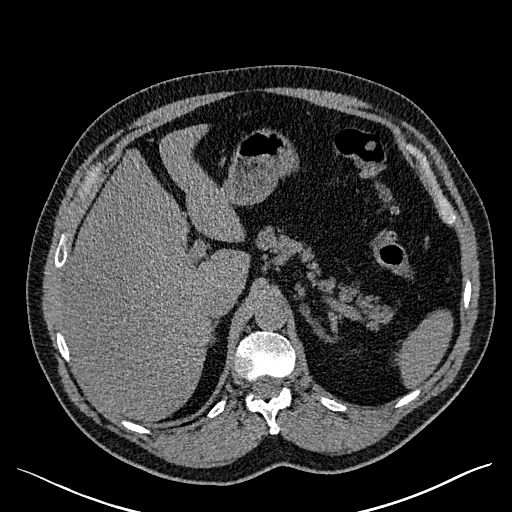
[im 12/162  lung]
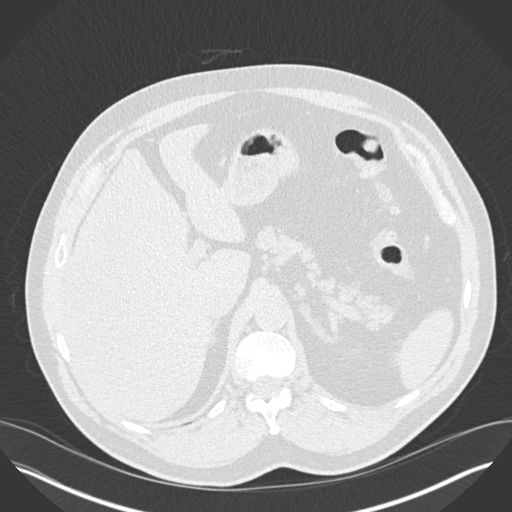
[im 24/162  lung]
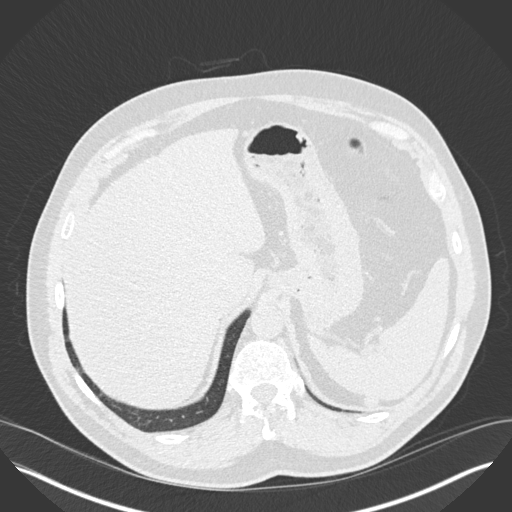
[im 33/162  lung]
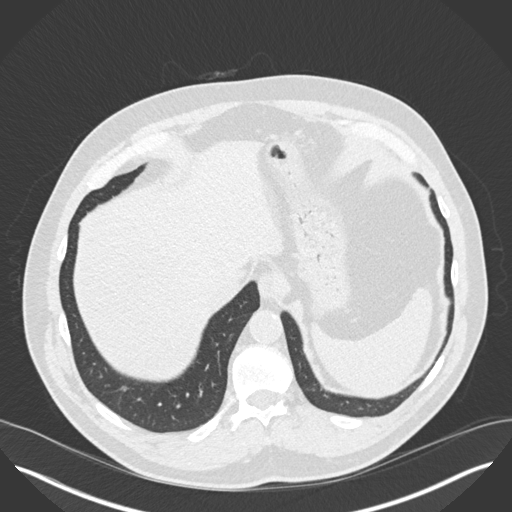
[im 42/162  lung]
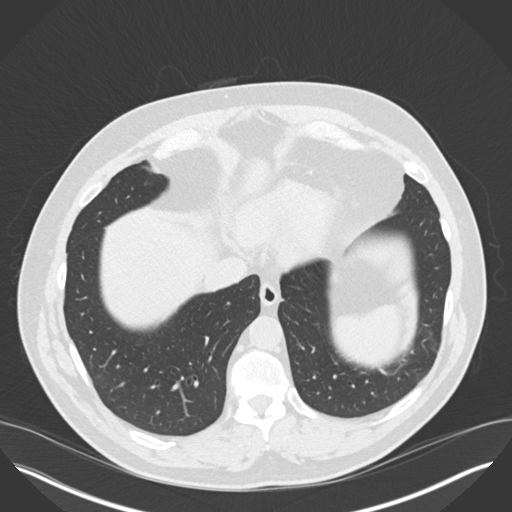
[im 54/162  mediastinal]
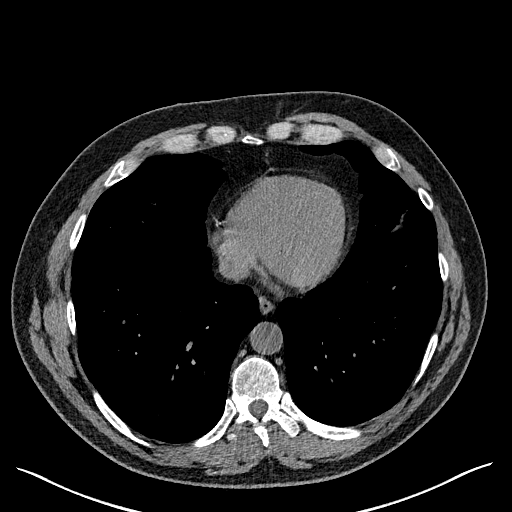
[im 54/162  lung]
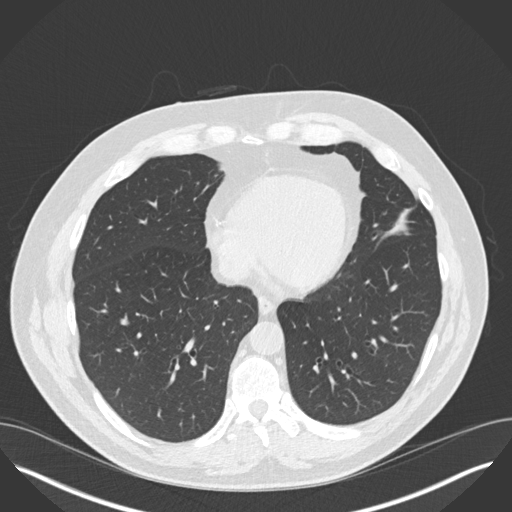
[im 65/162  lung]
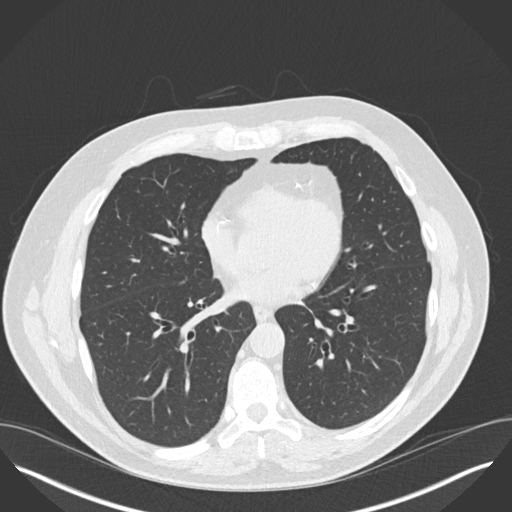
[im 72/162  lung]
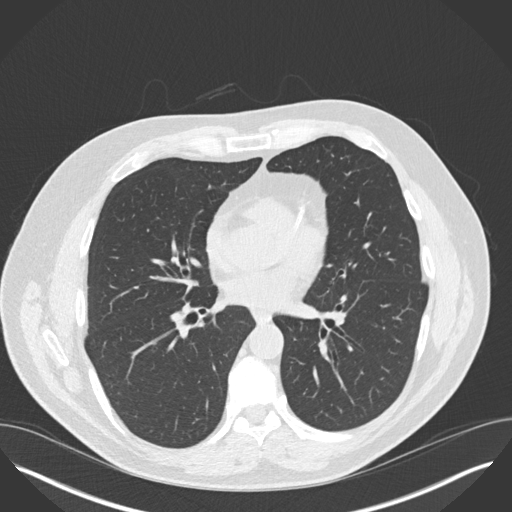
[im 84/162  lung]
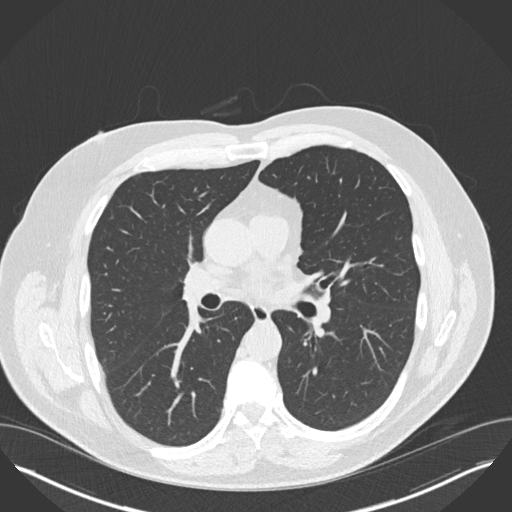
[im 90/162  mediastinal]
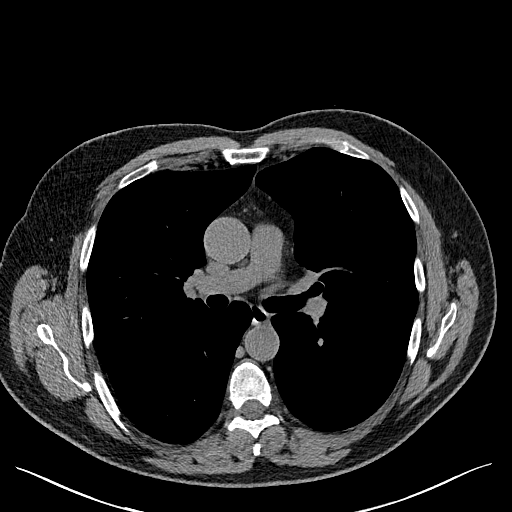
[im 90/162  lung]
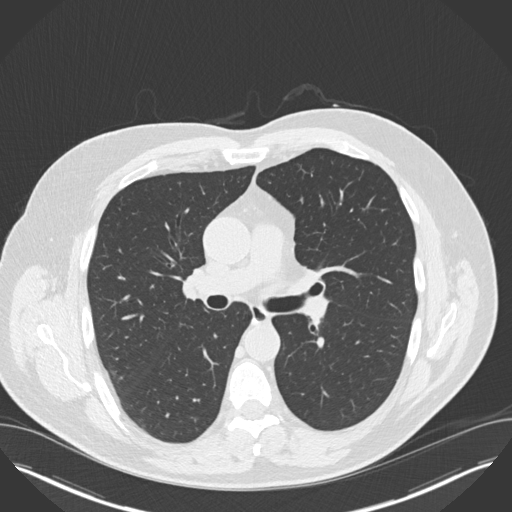
[im 97/162  lung]
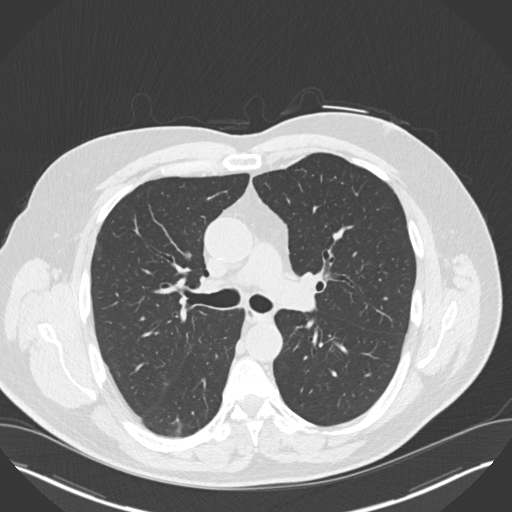
[im 108/162  lung]
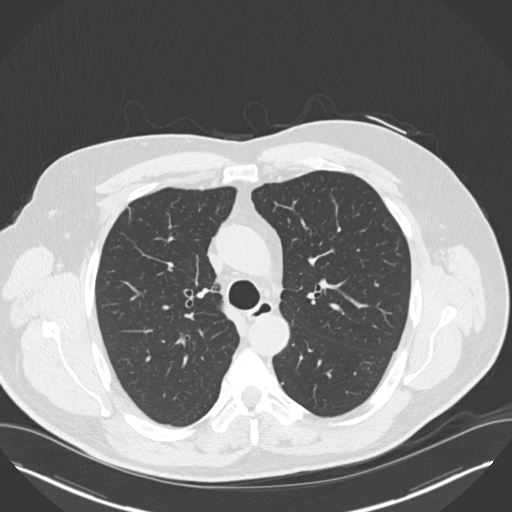
[im 120/162  lung]
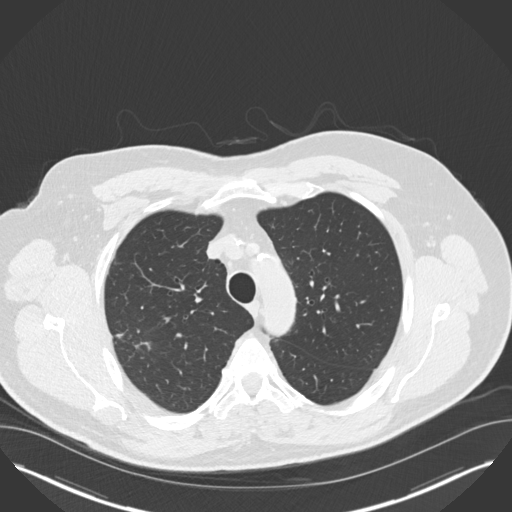
[im 129/162  mediastinal]
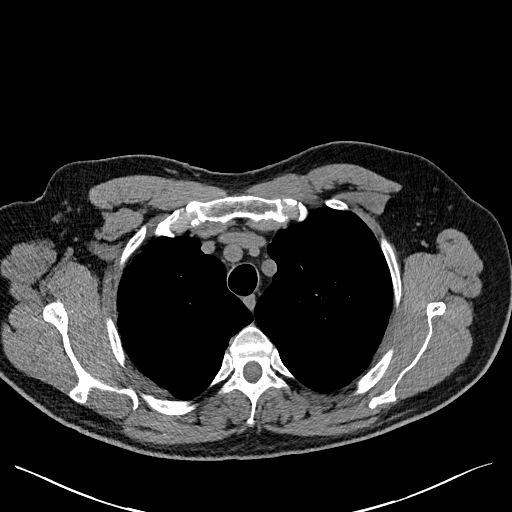
[im 129/162  lung]
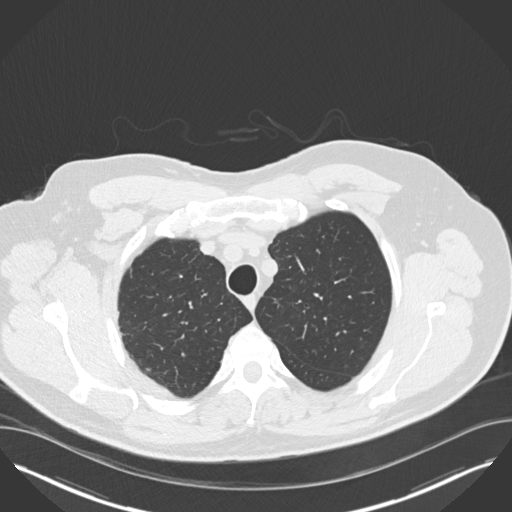
[im 138/162  lung]
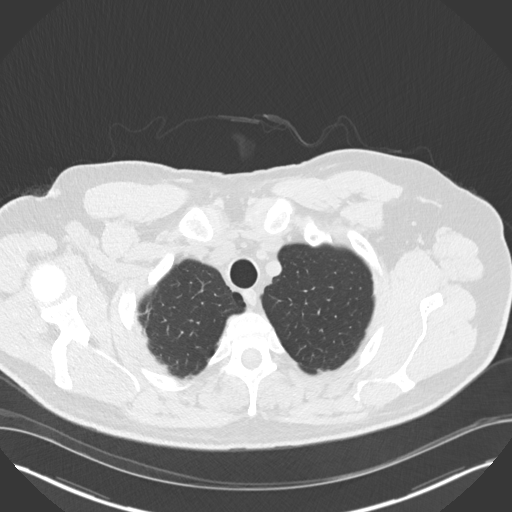
[im 150/162  lung]
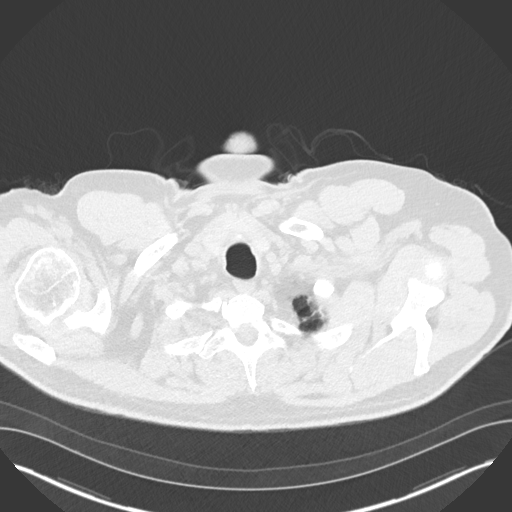

[15 of 34 positions shown; findings below may reference images not displayed]

FINDINGS: Chest wall: No chest wall mass, supraclavicular or axillary
lymphadenopathy. The thyroid gland is normal.

Cardiovascular: The heart is normal in size. No pericardial
effusion. The aorta is normal in caliber. Scattered atherosclerotic
calcifications. Mild tortuosity. Advanced coronary artery
calcifications for age.

Mediastinum/Nodes: No mediastinal or hilar mass or adenopathy. Small
scattered lymph nodes are stable. The esophagus is grossly normal.

Lungs/Pleura: Stable emphysematous changes hand areas of pulmonary
scarring. Subpleural partially calcified right upper lobe nodule on
image number 36 measures 4.5 mm and is stable.

Triangular density in the right upper lobe on image number 38
measures 16 x 12.5 mm and is stable and likely post pneumonic scar
tissue.

Stable elongated 5 mm nodular density near the right major fissure
on image number 61 which is likely a lymph node.

Stable calcified granulomas.

No new pulmonary lesions or acute pulmonary findings. Chronic
lingular scarring changes. No pleural effusion.

Upper Abdomen: No significant upper abdominal findings. Aortic
calcifications are noted.

Musculoskeletal: No significant bony findings.
IMPRESSION: 1. Stable pulmonary lesions as discussed above, most consistent with
benign process but recommend 12 month followup noncontrast chest CT
to document stability.
2. No mediastinal or hilar mass or adenopathy.
3. Stable aortic and coronary artery calcifications.
4. Stable emphysematous changes. No acute overlying pulmonary
findings.

## 2018-11-24 DIAGNOSIS — I25118 Atherosclerotic heart disease of native coronary artery with other forms of angina pectoris: Secondary | ICD-10-CM | POA: Insufficient documentation

## 2018-11-24 NOTE — Progress Notes (Deleted)
NO SHOW

## 2018-11-25 ENCOUNTER — Ambulatory Visit: Payer: Medicare Other | Admitting: Cardiovascular Disease

## 2018-11-25 DIAGNOSIS — R0989 Other specified symptoms and signs involving the circulatory and respiratory systems: Secondary | ICD-10-CM

## 2018-11-26 ENCOUNTER — Encounter: Payer: Self-pay | Admitting: Cardiovascular Disease

## 2019-12-21 IMAGING — CT CT CHEST W/O CM
2 of 3 series · 15 of 36 positions shown, 18 images · non-contrast
Comparison: 12/10/2016

CLINICAL DATA: Follow-up pulmonary nodule.

EXAM:
CT CHEST WITHOUT CONTRAST
TECHNIQUE: Multidetector CT imaging of the chest was performed following the
standard protocol without IV contrast.

[Series 2: thorax · axial · 0.74mm/px · z∈[-151,+175]mm · 12 of 193 slices shown, 15 images]
[im 15/193  mediastinal]
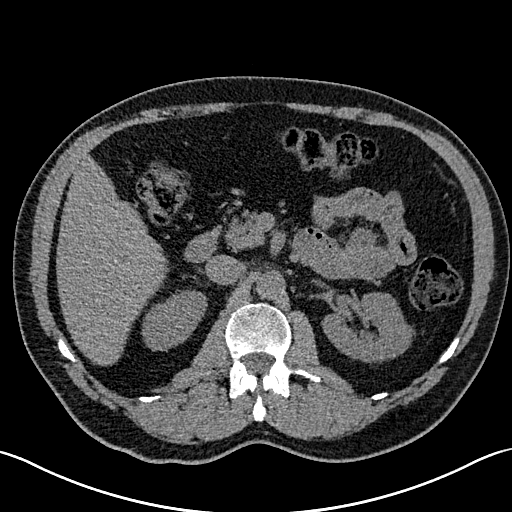
[im 15/193  lung]
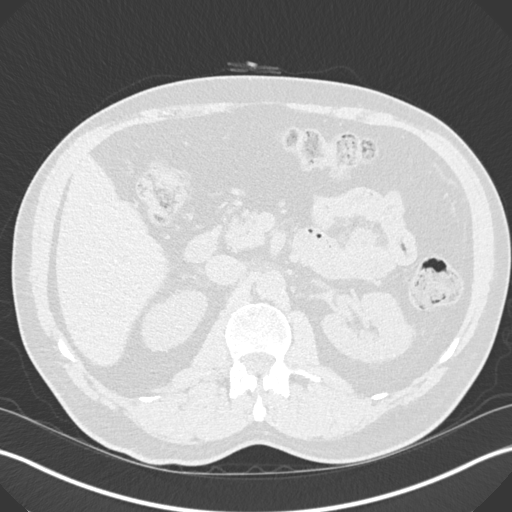
[im 29/193  lung]
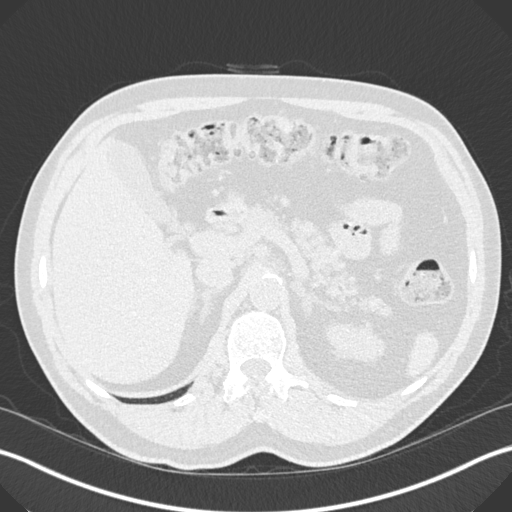
[im 43/193  lung]
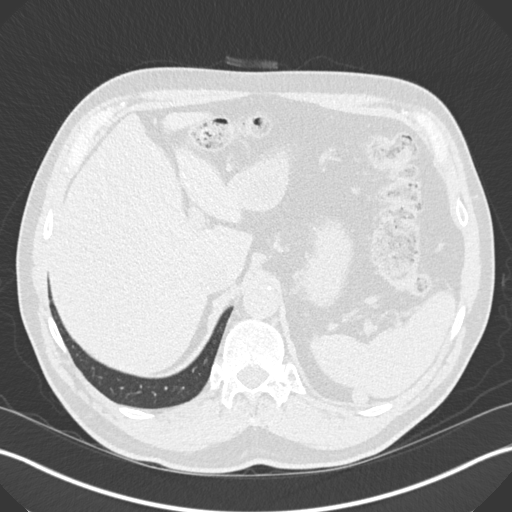
[im 57/193  lung]
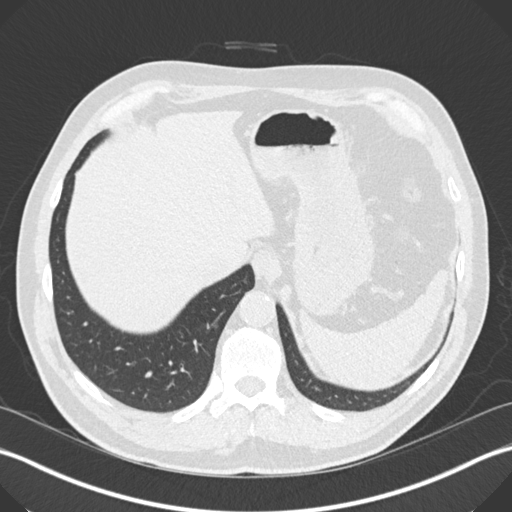
[im 72/193  mediastinal]
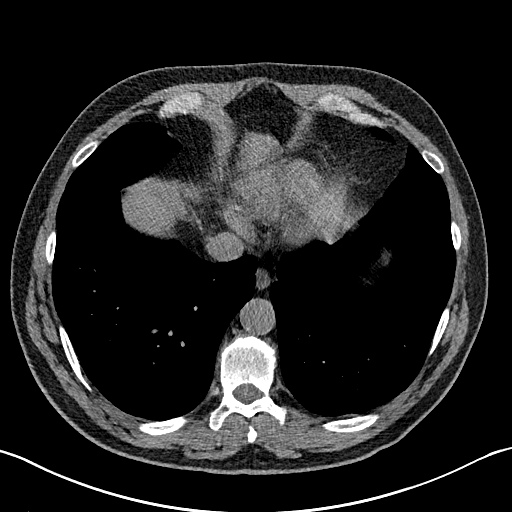
[im 72/193  lung]
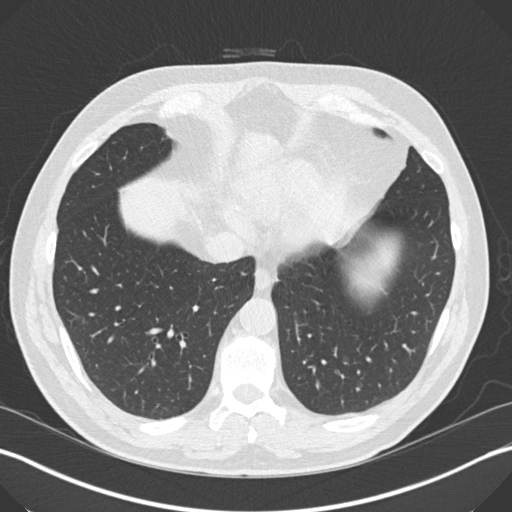
[im 86/193  lung]
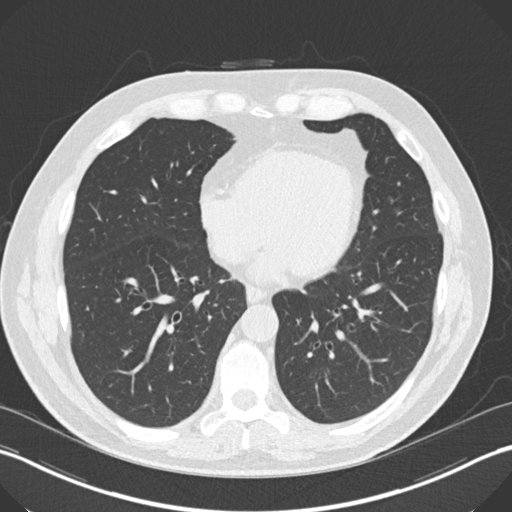
[im 107/193  lung]
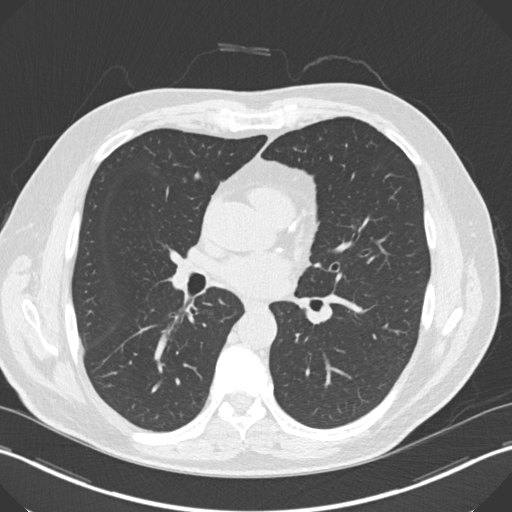
[im 121/193  lung]
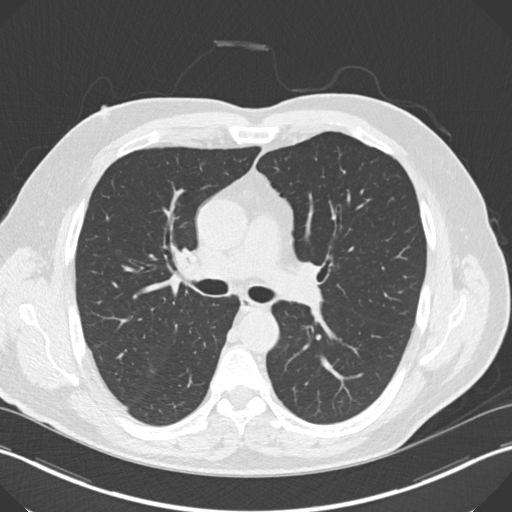
[im 136/193  mediastinal]
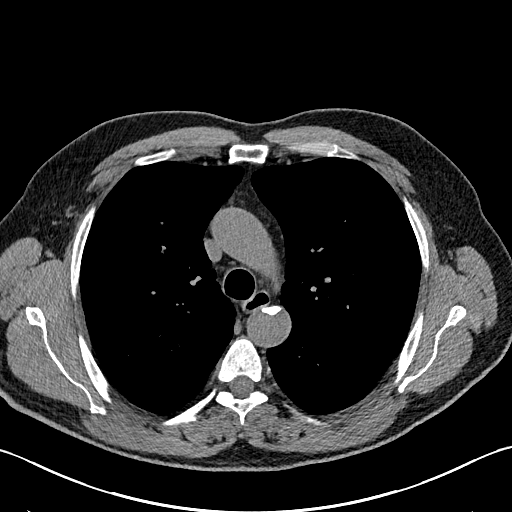
[im 136/193  lung]
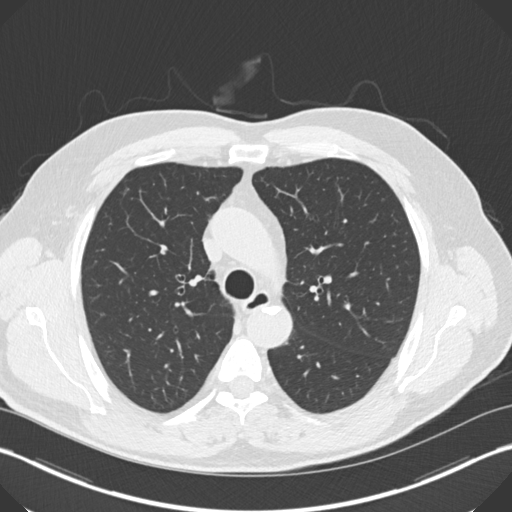
[im 150/193  lung]
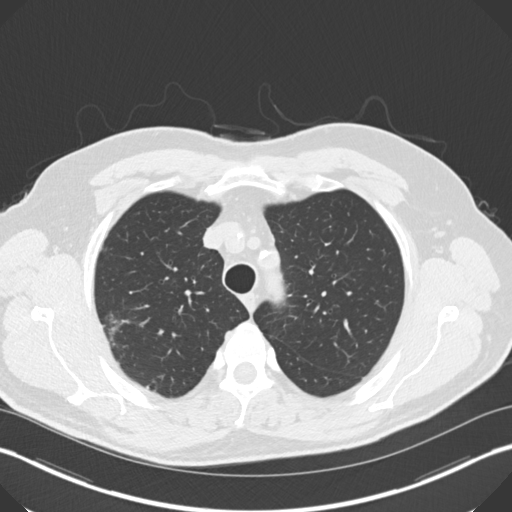
[im 164/193  lung]
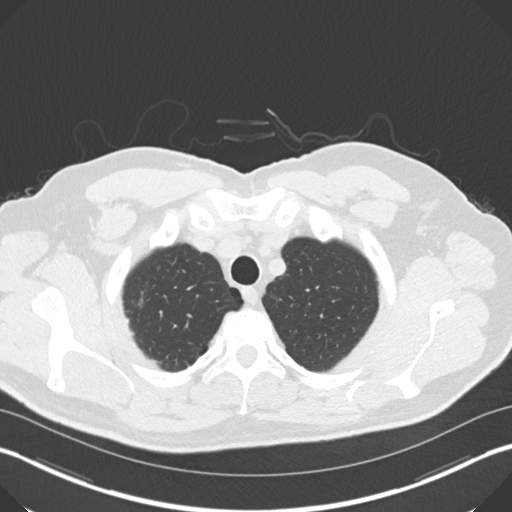
[im 178/193  lung]
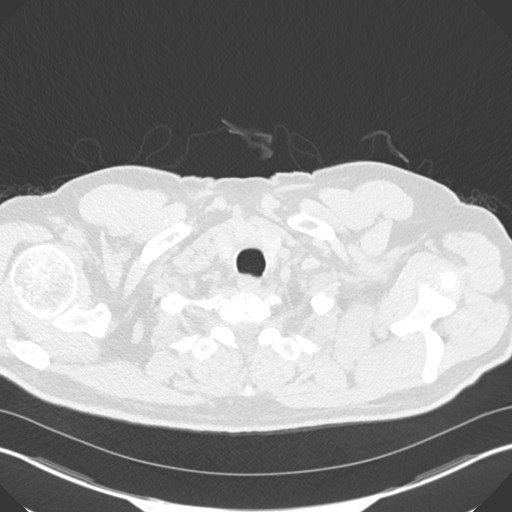

[Series 5: coronal · coronal · 0.75mm/px · 3 of 154 slices shown]
[im 31/154  lung]
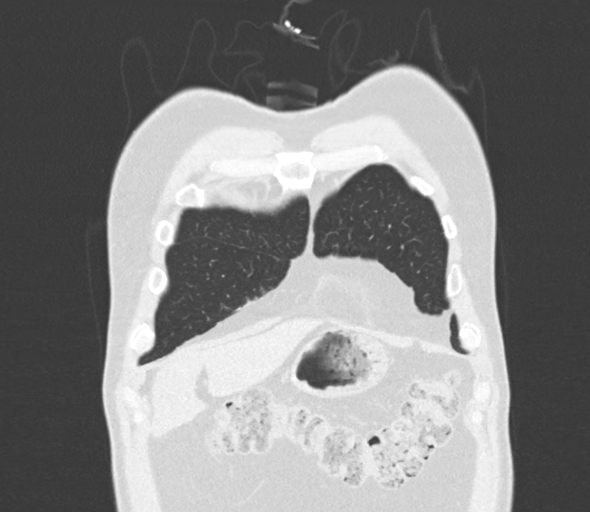
[im 62/154  lung]
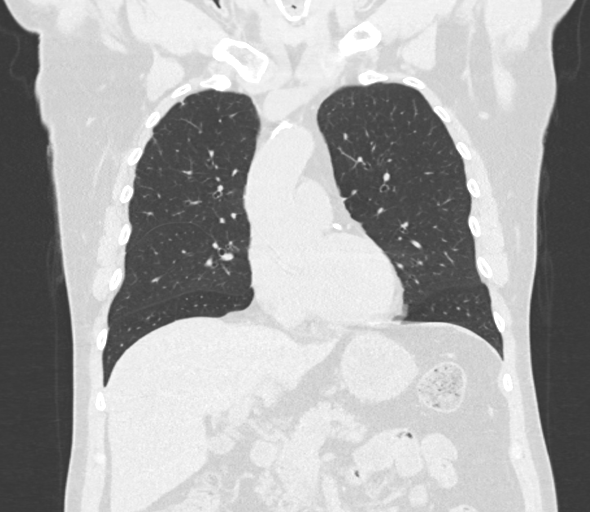
[im 92/154  lung]
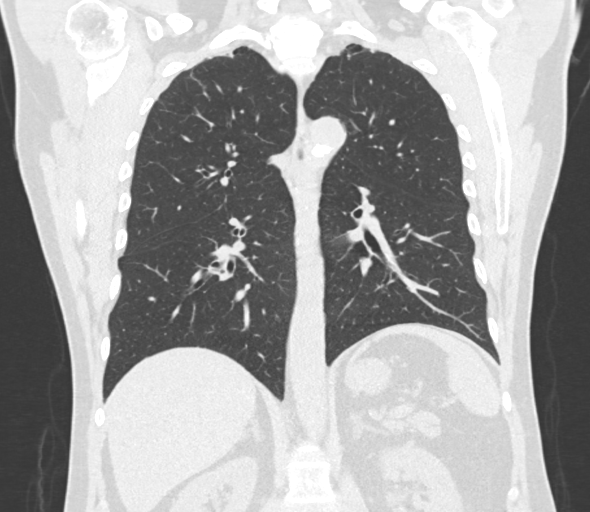

[15 of 36 positions shown; findings below may reference images not displayed]

FINDINGS: Cardiovascular: The heart size appears within normal limits. Aortic
atherosclerosis. Calcification in the LAD and left circumflex and
RCA coronary arteries noted.

Mediastinum/Nodes: Normal appearance of the thyroid gland. The
trachea appears patent and is midline. Normal appearance of the
esophagus.

Lungs/Pleura: No pleural effusion. Mild changes of emphysema.
Diffuse bronchial wall thickening noted.Scar like density within the
lateral right upper lobe appears less solid on today's study
compatible with a benign post inflammatory process, image 42/3.
Calcified granuloma identified within the right upper lobe. Small
granuloma in the posteromedial left lower lobe is again noted and
appears unchanged.

Upper Abdomen: No acute abnormality.

Musculoskeletal: No suspicious bone lesions. Degenerative disc
disease identified.
IMPRESSION: 1. No active cardiopulmonary abnormality.
2. Stable postinflammatory scarring in the lateral right upper lobe.
3. Calcified granulomas
4. Aortic Atherosclerosis (O76V3-GMS.S) and Emphysema (O76V3-QNO.9).
5. LAD, left circumflex and RCA atherosclerotic calcifications.

## 2022-06-04 ENCOUNTER — Encounter: Payer: Self-pay | Admitting: *Deleted

## 2022-07-25 ENCOUNTER — Ambulatory Visit: Payer: Self-pay | Admitting: Cardiovascular Disease

## 2022-07-25 NOTE — Progress Notes (Deleted)
Cardiology Office Note  Date:  07/25/2022   ID:  Mitchell Kennedy, DOB 22-Apr-1951, MRN [id number]  PCP:  Marguarite Arbour, MD   No chief complaint on file.   HPI:  71 year old gentleman with history of Coronary artery disease, previous PCI in [place] (details unavailable), 2012 PTSD, road rage, anxiety,  Depression Diabetes, HBA1C 7.8 Smoker, quit 5 years ago Hyperlipidemia, untreated Chronic bradycardia, asymptomatic Who presents for follow-up of his coronary disease, symptoms of chest pain   Last seen by myself July 2018  He reports that over the past several years he has had episodes of chest pain These seem to percent more with stress, anxiety, PTSD episodes Less with exertion such as walking  He is not very active at baseline, no regular exercise program He does not remember the details of his previous catheterization Notes indicating this was 2012  Records have been requested  He is not on a cholesterol medication, never tried one in the past Lab work from primary care reviewed with him in detail  Total chol 202, LDL 111  EKG personally reviewed by myself on todays visit Shows sinus bradycardia rate 50 bpm no significant ST or T-wave changes  CT scan chest January 2018 reviewed indicating advanced coronary artery calcifications for age, atherosclerotic calcifications in the aorta  PMH:   has a past medical history of ASCVD (arteriosclerotic cardiovascular disease), Depression, Diabetes mellitus without complication (HCC), Hypertension, Migraines, PTSD (post-traumatic stress disorder), Sleep apnea, and Stroke (HCC).  PSH:    Past Surgical History:  Procedure Laterality Date   CORONARY ANGIOPLASTY WITH STENT PLACEMENT     x 2 stent   HERNIA REPAIR      Current Outpatient Medications  Medication Sig Dispense Refill   ALPRAZolam (XANAX) 0.25 MG tablet Take 0.25 mg by mouth at bedtime as needed for anxiety.     amitriptyline (ELAVIL) 25 MG tablet Take 25 mg  by mouth at bedtime.     aspirin EC 81 MG tablet Take 1 tablet by mouth 1 day or 1 dose.     citalopram (CELEXA) 40 MG tablet Take 1 tablet by mouth 1 day or 1 dose.     clopidogrel (PLAVIX) 75 MG tablet Take 1 tablet by mouth 1 day or 1 dose.     isosorbide dinitrate (ISORDIL) 30 MG tablet Take by mouth.     pantoprazole (PROTONIX) 40 MG tablet Take 1 tablet by mouth 1 day or 1 dose.     primidone (MYSOLINE) 50 MG tablet Take 50 mg by mouth 2 (two) times daily.     propranolol (INDERAL) 60 MG tablet Take 60 mg by mouth daily in the afternoon.     No current facility-administered medications for this visit.     Allergies:   Patient has no known allergies.   Social History:  The patient  reports that he has quit smoking. His smoking use included cigarettes. He has a 50.00 pack-year smoking history. He has never used smokeless tobacco. He reports that he does not drink alcohol and does not use drugs.   Family History:   family history includes Cancer in his maternal uncle; Cancer - Other in his maternal uncle; Cervical cancer in his maternal aunt; Heart disease in his father; Heart failure in his father; Lung cancer in his maternal uncle.    Review of Systems: ROS   PHYSICAL EXAM: VS:  There were no vitals taken for this visit. , BMI There is no height or weight on file  to calculate BMI. GEN: Well nourished, well developed, in no acute distress HEENT: normal Neck: no JVD, carotid bruits, or masses Cardiac: RRR; no murmurs, rubs, or gallops,no edema  Respiratory:  clear to auscultation bilaterally, normal work of breathing GI: soft, nontender, nondistended, + BS MS: no deformity or atrophy Skin: warm and dry, no rash Neuro:  Strength and sensation are intact Psych: euthymic mood, full affect    Recent Labs: No results found for requested labs within last 365 days.    Lipid Panel No results found for: "CHOL", "HDL", "LDLCALC", "TRIG"    Wt Readings from Last 3 Encounters:   01/24/18 197 lb 12.8 oz (89.7 kg)  05/17/17 205 lb 4 oz (93.1 kg)  12/14/16 212 lb (96.2 kg)       ASSESSMENT AND PLAN:  Problem List Items Addressed This Visit   None    Disposition:   F/U  12 months   Total encounter time more than 30 minutes  Greater than 50% was spent in counseling and coordination of care with the patient    Signed, Dossie Arbour, M.D., Ph.D. [facility] [place], [place] [phone]

## 2022-09-18 NOTE — Progress Notes (Deleted)
NO SHOW

## 2022-09-19 ENCOUNTER — Ambulatory Visit: Payer: Self-pay | Attending: Cardiovascular Disease | Admitting: Cardiovascular Disease

## 2022-09-19 ENCOUNTER — Encounter: Payer: Self-pay | Admitting: Cardiovascular Disease

## 2022-12-21 ENCOUNTER — Encounter: Payer: Self-pay | Admitting: Cardiovascular Disease

## 2023-06-28 ENCOUNTER — Telehealth: Payer: Self-pay | Admitting: Cardiovascular Disease

## 2023-06-28 NOTE — Telephone Encounter (Signed)
Call placed to the patient who gave permission to speak to his Kennedy.  The patient was prescribed Isordil by Dr. Judithann Sheen. The patient has been having headaches and joint pain since starting the medication. Dr. Judithann Sheen advised he be seen as soon as possible by cardiology due to the side effects. The patient currently has a new patient appointment in October with Dr. Mariah Milling.   Mitchell Kennedy is his Kennedy who is already an established patient of Dr. Mariah Milling.

## 2023-06-28 NOTE — Telephone Encounter (Signed)
Pt c/o medication issue:  1. Name of Medication:   isosorbide dinitrate (ISORDIL) 30 MG tablet   2. How are you currently taking this medication (dosage and times per day)?  As prescribed  3. Are you having a reaction (difficulty breathing--STAT)?  Joint pains, muscle weakness and headaches  4. What is your medication issue?   Wife wants to know if patient can get a medication change.

## 2023-08-26 NOTE — Progress Notes (Deleted)
Cardiology Office Note  Date:  08/26/2023   ID:  Mitchell Kennedy, DOB 1951-07-15, MRN [id number]  PCP:  Marguarite Arbour, MD   No chief complaint on file.   HPI:  72 year old gentleman with history of Coronary artery disease, previous PCI in [place] (details unavailable), 2012 Three-vessel coronary calcification on CT scan PTSD, road rage, anxiety,  Depression Diabetes, HBA1C 7.8 Smoker, quit 5 years ago Hyperlipidemia, untreated Chronic bradycardia, asymptomatic Who presents by referral from Ignacia Bayley for chest pain symptoms  Last seen by myself in 2018   He reports that over the past several years he has had episodes of chest pain These seem to percent more with stress, anxiety, PTSD episodes Less with exertion such as walking   He is not very active at baseline, no regular exercise program He does not remember the details of his previous catheterization Notes indicating this was 2012  Records have been requested   He is not on a cholesterol medication, never tried one in the past Lab work from primary care reviewed with him in detail  Total chol 202, LDL 111   EKG personally reviewed by myself on todays visit Shows sinus bradycardia rate 50 bpm no significant ST or T-wave changes   CT scan chest January 2018 reviewed indicating advanced coronary artery calcifications for age, atherosclerotic calcifications in the aorta  PMH:   has a past medical history of ASCVD (arteriosclerotic cardiovascular disease), Depression, Diabetes mellitus without complication (HCC), Hypertension, Migraines, PTSD (post-traumatic stress disorder), Sleep apnea, and Stroke (HCC).  PSH:    Past Surgical History:  Procedure Laterality Date   CORONARY ANGIOPLASTY WITH STENT PLACEMENT     x 2 stent   HERNIA REPAIR      Current Outpatient Medications  Medication Sig Dispense Refill   ALPRAZolam (XANAX) 0.25 MG tablet Take 0.25 mg by mouth at bedtime as needed for anxiety.      amitriptyline (ELAVIL) 25 MG tablet Take 25 mg by mouth at bedtime.     aspirin EC 81 MG tablet Take 1 tablet by mouth 1 day or 1 dose.     citalopram (CELEXA) 40 MG tablet Take 1 tablet by mouth 1 day or 1 dose.     clopidogrel (PLAVIX) 75 MG tablet Take 1 tablet by mouth 1 day or 1 dose.     isosorbide dinitrate (ISORDIL) 30 MG tablet Take by mouth.     pantoprazole (PROTONIX) 40 MG tablet Take 1 tablet by mouth 1 day or 1 dose.     primidone (MYSOLINE) 50 MG tablet Take 50 mg by mouth 2 (two) times daily.     propranolol (INDERAL) 60 MG tablet Take 60 mg by mouth daily in the afternoon.     No current facility-administered medications for this visit.     Allergies:   Patient has no known allergies.   Social History:  The patient  reports that he has quit smoking. His smoking use included cigarettes. He has a 50 pack-year smoking history. He has never used smokeless tobacco. He reports that he does not drink alcohol and does not use drugs.   Family History:   family history includes Cancer in his maternal uncle; Cancer - Other in his maternal uncle; Cervical cancer in his maternal aunt; Heart disease in his father; Heart failure in his father; Lung cancer in his maternal uncle.    Review of Systems: ROS   PHYSICAL EXAM: VS:  There were no vitals taken for this visit. ,  BMI There is no height or weight on file to calculate BMI. GEN: Well nourished, well developed, in no acute distress HEENT: normal Neck: no JVD, carotid bruits, or masses Cardiac: RRR; no murmurs, rubs, or gallops,no edema  Respiratory:  clear to auscultation bilaterally, normal work of breathing GI: soft, nontender, nondistended, + BS MS: no deformity or atrophy Skin: warm and dry, no rash Neuro:  Strength and sensation are intact Psych: euthymic mood, full affect    Recent Labs: No results found for requested labs within last 365 days.    Lipid Panel No results found for: "CHOL", "HDL", "LDLCALC",  "TRIG"    Wt Readings from Last 3 Encounters:  01/24/18 197 lb 12.8 oz (89.7 kg)  05/17/17 205 lb 4 oz (93.1 kg)  12/14/16 212 lb (96.2 kg)       ASSESSMENT AND PLAN:  Problem List Items Addressed This Visit   None    Disposition:   F/U  12 months   Total encounter time more than 30 minutes  Greater than 50% was spent in counseling and coordination of care with the patient    Signed, Dossie Arbour, M.D., Ph.D. [facility] [place], [place] [phone]

## 2023-08-27 ENCOUNTER — Ambulatory Visit: Payer: Self-pay | Attending: Cardiovascular Disease | Admitting: Cardiovascular Disease

## 2023-08-27 DIAGNOSIS — E119 Type 2 diabetes mellitus without complications: Secondary | ICD-10-CM

## 2023-08-27 DIAGNOSIS — I10 Essential (primary) hypertension: Secondary | ICD-10-CM

## 2023-08-27 DIAGNOSIS — I25118 Atherosclerotic heart disease of native coronary artery with other forms of angina pectoris: Secondary | ICD-10-CM

## 2023-09-10 ENCOUNTER — Ambulatory Visit: Payer: Self-pay | Admitting: Cardiovascular Disease

## 2023-11-03 NOTE — Progress Notes (Unsigned)
Cardiology Office Note  Date:  11/04/2023   ID:  Sayan, Gieser Apr 09, 1951, MRN [id number]  PCP:  Marguarite Arbour, MD   Chief Complaint  Patient presents with   New Patient (Initial Visit)    Ref by Dr. Judithann Sheen for chest pain. Medications reviewed by the patient verbally. Patient c/o shortness of breath & chest pain with activity.     HPI:  72 year old gentleman with history of Coronary artery disease, previous PCI in [place] (details unavailable), 2012 PTSD, road rage, anxiety, Depression Diabetes,  Smoker,  Hyperlipidemia, untreated Chronic bradycardia, asymptomatic Long history atypical chest pain presenting with stress anxiety PTSD episodes Coronary calcification on CT scan chest Who presents by referral from Lodema Hong for consultation of his chest pain symptoms By myself in clinic 2018  Presents today with his wife Reports having chest pain symptoms soon after Lamictal started by PMD, Side effects:  Impotence, : chest pain every morning (squeezing in chest, better with asa 2-3 every morning) Chest pain symptoms seem to resolve after he stopped the Lamictal and was started on alternate medication  Sedentary baseline, no regular exercise program  CT scan chest from 2019 pulled up and reviewed showing prior coronary stent, significant three-vessel coronary calcification  Currently not on cholesterol medication  Blood pressure running high, does not check it at home  EKG personally reviewed by myself on todays visit EKG Interpretation Date/Time:  Monday November 04 2023 16:13:45 EST Ventricular Rate:  59 PR Interval:  162 QRS Duration:  116 QT Interval:  442 QTC Calculation: 437 R Axis:   -36  Text Interpretation: Sinus bradycardia Left axis deviation Incomplete right bundle branch block When compared with ECG of 16-Jul-2015 14:50, No significant change was found Confirmed by Julien Nordmann [id number] on 11/04/2023 4:23:22 PM    PMH:   has a past  medical history of ASCVD (arteriosclerotic cardiovascular disease), Depression, Diabetes mellitus without complication (HCC), Essential tremor, Hypertension, Migraines, PTSD (post-traumatic stress disorder), Sleep apnea, and Stroke (HCC).  PSH:    Past Surgical History:  Procedure Laterality Date   CORONARY ANGIOPLASTY WITH STENT PLACEMENT     x 2 stent   HERNIA REPAIR      Current Outpatient Medications  Medication Sig Dispense Refill   aspirin EC 81 MG tablet Take 1 tablet by mouth 1 day or 1 dose.     citalopram (CELEXA) 40 MG tablet Take 1 tablet by mouth 1 day or 1 dose.     glimepiride (AMARYL) 2 MG tablet Take 1 tablet by mouth daily with breakfast.     isosorbide dinitrate (ISORDIL) 30 MG tablet Take 60 mg by mouth at bedtime.     pantoprazole (PROTONIX) 40 MG tablet Take 1 tablet by mouth 1 day or 1 dose.     ziprasidone (GEODON) 40 MG capsule Take 40 mg by mouth 2 (two) times daily with a meal.     ALPRAZolam (XANAX) 0.25 MG tablet Take 0.25 mg by mouth at bedtime as needed for anxiety. (Patient not taking: Reported on 11/04/2023)     amitriptyline (ELAVIL) 25 MG tablet Take 25 mg by mouth at bedtime. (Patient not taking: Reported on 11/04/2023)     clopidogrel (PLAVIX) 75 MG tablet Take 1 tablet by mouth 1 day or 1 dose. (Patient not taking: Reported on 11/04/2023)     primidone (MYSOLINE) 50 MG tablet Take 50 mg by mouth 2 (two) times daily. (Patient not taking: Reported on 11/04/2023)     propranolol (INDERAL) 60  MG tablet Take 60 mg by mouth daily in the afternoon. (Patient not taking: Reported on 11/04/2023)     No current facility-administered medications for this visit.     Allergies:   Patient has no known allergies.   Social History:  The patient  reports that he has quit smoking. His smoking use included cigarettes. He has a 50 pack-year smoking history. He has never used smokeless tobacco. He reports that he does not drink alcohol and does not use drugs.   Family  History:   family history includes Cancer in his maternal uncle; Cancer - Other in his maternal uncle; Cervical cancer in his maternal aunt; Heart disease in his father; Heart failure in his father; Lung cancer in his maternal uncle.    Review of Systems: Review of Systems  Constitutional: Negative.   HENT: Negative.    Respiratory: Negative.    Cardiovascular: Negative.   Gastrointestinal: Negative.   Musculoskeletal: Negative.   Neurological: Negative.   Psychiatric/Behavioral: Negative.    All other systems reviewed and are negative.    PHYSICAL EXAM: VS:  BP (!) 158/80 (BP Location: Right Arm, Patient Position: Sitting, Cuff Size: Normal)   Pulse (!) 59   Ht 5\' 10"  (1.778 m)   Wt 185 lb 2 oz (84 kg)   SpO2 97%   BMI 26.56 kg/m  , BMI Body mass index is 26.56 kg/m. GEN: Well nourished, well developed, in no acute distress HEENT: normal Neck: no JVD, carotid bruits, or masses Cardiac: RRR; no murmurs, rubs, or gallops,no edema  Respiratory:  clear to auscultation bilaterally, normal work of breathing GI: soft, nontender, nondistended, + BS MS: no deformity or atrophy Skin: warm and dry, no rash Neuro:  Strength and sensation are intact Psych: euthymic mood, full affect   Recent Labs: No results found for requested labs within last 365 days.    Lipid Panel No results found for: "CHOL", "HDL", "LDLCALC", "TRIG"    Wt Readings from Last 3 Encounters:  11/04/23 185 lb 2 oz (84 kg)  01/24/18 197 lb 12.8 oz (89.7 kg)  05/17/17 205 lb 4 oz (93.1 kg)      ASSESSMENT AND PLAN:  Problem List Items Addressed This Visit       Cardiology Problems   Atherosclerosis of native coronary artery of native heart with stable angina pectoris (HCC) - Primary   Relevant Orders   EKG 12-Lead (Completed)   Benign hypertension   Relevant Orders   EKG 12-Lead (Completed)     Other   Diabetes mellitus without complication (HCC)   Relevant Medications   glimepiride (AMARYL) 2  MG tablet   Other Visit Diagnoses       Atypical chest pain       Relevant Orders   EKG 12-Lead (Completed)      Coronary artery disease with stable angina Heavy three-vessel coronary calcification on CT scan from 2019 Recent episodes of chest pain concerning for angina Not a good candidate for cardiac CTA given prior stenting over 10 years ago Recommend pharmacologic Myoview to rule out ischemia Continue aspirin, add Zetia 10 mg daily as he prefers not to be on a statin  Smoker We have encouraged him to continue to work on weaning his cigarettes and smoking cessation. He will continue to work on this and does not want any assistance with chantix.    Hyperlipidemia Goal LDL less than 55 Given his body ache he prefers not to be on a statin Recommend he start  Zetia 10 mg daily May need to try PCSK9 inhibitor if numbers continue to run high  Essential hypertension Blood pressure elevated with primary care, elevated today, he does not check pressures at home Recommend he start amlodipine 5 mg daily for 2 weeks, for systolic pressure over 140 recommend he increase up to amlodipine 10    Signed, Dossie Arbour, M.D., Ph.D. [facility] [place], [place] [phone]

## 2023-11-04 ENCOUNTER — Ambulatory Visit: Payer: Medicare Other | Attending: Cardiovascular Disease | Admitting: Cardiovascular Disease

## 2023-11-04 ENCOUNTER — Encounter: Payer: Self-pay | Admitting: Cardiovascular Disease

## 2023-11-04 VITALS — BP 158/80 | HR 59 | Ht 70.0 in | Wt 185.1 lb

## 2023-11-04 DIAGNOSIS — I25118 Atherosclerotic heart disease of native coronary artery with other forms of angina pectoris: Secondary | ICD-10-CM

## 2023-11-04 DIAGNOSIS — E119 Type 2 diabetes mellitus without complications: Secondary | ICD-10-CM | POA: Diagnosis not present

## 2023-11-04 DIAGNOSIS — I10 Essential (primary) hypertension: Secondary | ICD-10-CM | POA: Diagnosis not present

## 2023-11-04 DIAGNOSIS — R079 Chest pain, unspecified: Secondary | ICD-10-CM

## 2023-11-04 DIAGNOSIS — R0789 Other chest pain: Secondary | ICD-10-CM | POA: Diagnosis present

## 2023-11-04 MED ORDER — EZETIMIBE 10 MG PO TABS: 10.0000 mg | ORAL_TABLET | Freq: Every day | ORAL | 3 refills | Status: AC

## 2023-11-04 MED ORDER — AMLODIPINE BESYLATE 10 MG PO TABS: 10.0000 mg | ORAL_TABLET | Freq: Every day | ORAL | 3 refills | Status: AC

## 2023-11-04 NOTE — Patient Instructions (Addendum)
Medication Instructions:  Please start zetia 10 mg daily, for cholesterol  Please start amlodipine 10 mg daily for blood pressure (start 1/2 pill for 2 weeks, then increase up to whole pill for pressure >140 on the top number) Goal top number 120-130  If you need a refill on your cardiac medications before your next appointment, please call your pharmacy.   Lab work: No new labs needed  Testing/Procedures:  Your provider has ordered a Lexiscan/ Exercise Myoview Stress test. This will take place at [facility]. Please report to the [facility] entrance. The volunteers at the first desk will direct you where to go.  [facility] MYOVIEW  Your provider has ordered a Stress Test with nuclear imaging. The purpose of this test is to evaluate the blood supply to your heart muscle. This procedure is referred to as a "Non-Invasive Stress Test." This is because other than having an IV started in your vein, nothing is inserted or "invades" your body. Cardiac stress tests are done to find areas of poor blood flow to the heart by determining the extent of coronary artery disease (CAD). Some patients exercise on a treadmill, which naturally increases the blood flow to your heart, while others who are unable to walk on a treadmill due to physical limitations will have a pharmacologic/chemical stress agent called Lexiscan . This medicine will mimic walking on a treadmill by temporarily increasing your coronary blood flow.   Please note: these test may take anywhere between 2-4 hours to complete  How to prepare for your Myoview test:  Nothing to eat for 6 hours prior to the test No caffeine for 24 hours prior to test No smoking 24 hours prior to test. Your medication may be taken with water.  If your doctor stopped a medication because of this test, do not take that medication. Ladies, please do not wear dresses.  Skirts or pants are appropriate. Please wear a short sleeve shirt. No perfume, cologne or  lotion. Wear comfortable walking shoes. No heels!   PLEASE NOTIFY THE OFFICE AT LEAST 24 HOURS IN ADVANCE IF YOU ARE UNABLE TO KEEP YOUR APPOINTMENT.  [phone] AND  PLEASE NOTIFY NUCLEAR MEDICINE AT [facility] AT LEAST 24 HOURS IN ADVANCE IF YOU ARE UNABLE TO KEEP YOUR APPOINTMENT. [phone]   Follow-Up: At [facility], you and your health needs are our priority.  As part of our continuing mission to provide you with exceptional heart care, we have created designated Provider Care Teams.  These Care Teams include your primary Cardiologist (physician) and Advanced Practice Providers (APPs -  Physician Assistants and Nurse Practitioners) who all work together to provide you with the care you need, when you need it.  You will need a follow up appointment in 6 months  Providers on your designated Care Team:   Nicolasa Ducking, NP Eula Listen, PA-C Cadence Fransico [place], [place]  COVID-19 Vaccine Information can be found at: [email or web address] For questions related to vaccine distribution or appointments, please email [email or web address] or call [phone].

## 2023-11-12 ENCOUNTER — Telehealth: Payer: Self-pay | Admitting: Cardiovascular Disease

## 2023-11-12 ENCOUNTER — Inpatient Hospital Stay: Admission: RE | Admit: 2023-11-12 | Payer: Self-pay | Source: Ambulatory Visit

## 2023-11-12 NOTE — Telephone Encounter (Signed)
FYI--Patient's wife would like to inform Dr. Mariah Milling that stress test has been cancelled due to finances + lack of coverage.

## 2023-11-18 NOTE — Telephone Encounter (Signed)
 Called patient and notified him of the following from Dr. Gollan.  He can certainly call insurance carrier and see what out-of-pocket cost would be for Myoview  Typically there is a small co-pay For worsening chest pain would go to the emergency room Thx TGollan   Patient verbalizes understanding.

## 2024-08-07 ENCOUNTER — Other Ambulatory Visit: Payer: Self-pay | Admitting: Internal Medicine

## 2024-08-07 DIAGNOSIS — I10 Essential (primary) hypertension: Secondary | ICD-10-CM

## 2024-08-07 DIAGNOSIS — R053 Chronic cough: Secondary | ICD-10-CM

## 2024-08-11 ENCOUNTER — Ambulatory Visit
Admission: RE | Admit: 2024-08-11 | Discharge: 2024-08-11 | Disposition: A | Discharge status: Home / Self Care | Payer: Self-pay | Source: Ambulatory Visit | Attending: Internal Medicine | Admitting: Internal Medicine

## 2024-08-11 DIAGNOSIS — R053 Chronic cough: Secondary | ICD-10-CM | POA: Insufficient documentation

## 2024-08-11 DIAGNOSIS — I10 Essential (primary) hypertension: Secondary | ICD-10-CM | POA: Diagnosis present

## 2024-09-28 NOTE — Progress Notes (Deleted)
 Cardiology Office Note  Date:  09/28/2024   ID:  Mitchell Kennedy, Mitchell Kennedy 31-Dec-1950, MRN [id number]  PCP:  Auston Reyes BIRCH, MD   No chief complaint on file.   HPI:  73 year old gentleman with history of Coronary artery disease, previous PCI in [place] (details unavailable), 2012 PTSD, road rage, anxiety, Depression Diabetes,  Smoker,  Hyperlipidemia, untreated Chronic bradycardia, asymptomatic Long history atypical chest pain presenting with stress anxiety PTSD episodes Coronary calcification on CT scan chest Who presents by referral from Lamar Lies for consultation of his chest pain symptoms By myself in clinic 2018  Presents today with his wife LOV 10/2023  Reports having chest pain symptoms soon after Lamictal started by PMD, Side effects:  Impotence, : chest pain every morning (squeezing in chest, better with asa 2-3 every morning) Chest pain symptoms seem to resolve after he stopped the Lamictal and was started on alternate medication  Sedentary baseline, no regular exercise program  CT scan chest from 2019 pulled up and reviewed showing prior coronary stent, significant three-vessel coronary calcification  Currently not on cholesterol medication  Blood pressure running high, does not check it at home  EKG personally reviewed by myself on todays visit      PMH:   has a past medical history of ASCVD (arteriosclerotic cardiovascular disease), Depression, Diabetes mellitus without complication (HCC), Essential tremor, Hypertension, Migraines, PTSD (post-traumatic stress disorder), Sleep apnea, and Stroke (HCC).  PSH:    Past Surgical History:  Procedure Laterality Date   CORONARY ANGIOPLASTY WITH STENT PLACEMENT     x 2 stent   HERNIA REPAIR      Current Outpatient Medications  Medication Sig Dispense Refill   ALPRAZolam (XANAX) 0.25 MG tablet Take 0.25 mg by mouth at bedtime as needed for anxiety. (Patient not taking: Reported on 11/04/2023)      amitriptyline (ELAVIL) 25 MG tablet Take 25 mg by mouth at bedtime. (Patient not taking: Reported on 11/04/2023)     amLODipine  (NORVASC ) 10 MG tablet Take 1 tablet (10 mg total) by mouth daily. 90 tablet 3   aspirin  EC 81 MG tablet Take 1 tablet by mouth 1 day or 1 dose.     citalopram (CELEXA) 40 MG tablet Take 1 tablet by mouth 1 day or 1 dose.     clopidogrel (PLAVIX) 75 MG tablet Take 1 tablet by mouth 1 day or 1 dose. (Patient not taking: Reported on 11/04/2023)     ezetimibe  (ZETIA ) 10 MG tablet Take 1 tablet (10 mg total) by mouth daily. 90 tablet 3   glimepiride (AMARYL) 2 MG tablet Take 1 tablet by mouth daily with breakfast.     isosorbide dinitrate (ISORDIL) 30 MG tablet Take 60 mg by mouth at bedtime.     pantoprazole (PROTONIX) 40 MG tablet Take 1 tablet by mouth 1 day or 1 dose.     primidone (MYSOLINE) 50 MG tablet Take 50 mg by mouth 2 (two) times daily. (Patient not taking: Reported on 11/04/2023)     propranolol (INDERAL) 60 MG tablet Take 60 mg by mouth daily in the afternoon. (Patient not taking: Reported on 11/04/2023)     ziprasidone (GEODON) 40 MG capsule Take 40 mg by mouth 2 (two) times daily with a meal.     No current facility-administered medications for this visit.     Allergies:   Patient has no known allergies.   Social History:  The patient  reports that he has quit smoking. His smoking use included cigarettes. He  has a 50 pack-year smoking history. He has never used smokeless tobacco. He reports that he does not drink alcohol and does not use drugs.   Family History:   family history includes Cancer in his maternal uncle; Cancer - Other in his maternal uncle; Cervical cancer in his maternal aunt; Heart disease in his father; Heart failure in his father; Lung cancer in his maternal uncle.    Review of Systems: Review of Systems  Constitutional: Negative.   HENT: Negative.    Respiratory: Negative.    Cardiovascular: Negative.   Gastrointestinal:  Negative.   Musculoskeletal: Negative.   Neurological: Negative.   Psychiatric/Behavioral: Negative.    All other systems reviewed and are negative.    PHYSICAL EXAM: VS:  There were no vitals taken for this visit. , BMI There is no height or weight on file to calculate BMI. GEN: Well nourished, well developed, in no acute distress HEENT: normal Neck: no JVD, carotid bruits, or masses Cardiac: RRR; no murmurs, rubs, or gallops,no edema  Respiratory:  clear to auscultation bilaterally, normal work of breathing GI: soft, nontender, nondistended, + BS MS: no deformity or atrophy Skin: warm and dry, no rash Neuro:  Strength and sensation are intact Psych: euthymic mood, full affect   Recent Labs: No results found for requested labs within last 365 days.    Lipid Panel No results found for: CHOL, HDL, LDLCALC, TRIG    Wt Readings from Last 3 Encounters:  11/04/23 185 lb 2 oz (84 kg)  01/24/18 197 lb 12.8 oz (89.7 kg)  05/17/17 205 lb 4 oz (93.1 kg)      ASSESSMENT AND PLAN:  Problem List Items Addressed This Visit   None   Coronary artery disease with stable angina Heavy three-vessel coronary calcification on CT scan from 2019 Recent episodes of chest pain concerning for angina Not a good candidate for cardiac CTA given prior stenting over 10 years ago Recommend pharmacologic Myoview  to rule out ischemia Continue aspirin , add Zetia  10 mg daily as he prefers not to be on a statin  Smoker We have encouraged him to continue to work on weaning his cigarettes and smoking cessation. He will continue to work on this and does not want any assistance with chantix.    Hyperlipidemia Goal LDL less than 55 Given his body ache he prefers not to be on a statin Recommend he start Zetia  10 mg daily May need to try PCSK9 inhibitor if numbers continue to run high  Essential hypertension Blood pressure elevated with primary care, elevated today, he does not check pressures  at home Recommend he start amlodipine  5 mg daily for 2 weeks, for systolic pressure over 140 recommend he increase up to amlodipine  10    Signed, Velinda Lunger, M.D., Ph.D. [facility] [place], [place] [phone]

## 2024-09-29 ENCOUNTER — Ambulatory Visit: Payer: Self-pay | Admitting: Cardiovascular Disease

## 2024-09-29 DIAGNOSIS — E119 Type 2 diabetes mellitus without complications: Secondary | ICD-10-CM

## 2024-09-29 DIAGNOSIS — I10 Essential (primary) hypertension: Secondary | ICD-10-CM

## 2024-09-29 DIAGNOSIS — I25118 Atherosclerotic heart disease of native coronary artery with other forms of angina pectoris: Secondary | ICD-10-CM

## 2024-09-29 DIAGNOSIS — R079 Chest pain, unspecified: Secondary | ICD-10-CM

## 2024-09-29 DIAGNOSIS — R0789 Other chest pain: Secondary | ICD-10-CM

## 2024-09-29 DIAGNOSIS — I2510 Atherosclerotic heart disease of native coronary artery without angina pectoris: Secondary | ICD-10-CM

## 2024-11-15 NOTE — Progress Notes (Deleted)
 Cardiology Office Note  Date:  11/15/2024   ID:  Mitchell Kennedy, DOB [birth year], MRN [id number]  PCP:  Auston Reyes BIRCH, MD   No chief complaint on file.   HPI:  74 year old gentleman with history of Coronary artery disease, previous PCI in [place] (details unavailable), 2012 PTSD, road rage, anxiety, Depression Diabetes,  Smoker,  Hyperlipidemia, untreated Chronic bradycardia, asymptomatic Long history atypical chest pain presenting with stress anxiety PTSD episodes Coronary calcification on CT scan chest Who presents by referral from Lam[facility] Lies for consultation of his chest pain symptoms By myself in clinic 2018  LOV 12/24   Presents today with his wife Reports having chest pain symptoms soon after Lamictal started by PMD, Side effects:  Impotence, : chest pain every morning (squeezing in chest, better with asa 2-3 every morning) Chest pain symptoms seem to resolve after he stopped the Lamictal and was started on alternate medication  Sedentary baseline, no regular exercise program  CT scan chest from 2019 pulled up and reviewed showing prior coronary stent, significant three-vessel coronary calcification  Currently not on cholesterol medication  Blood pressure running high, does not check it at home  EKG personally reviewed by myself on todays visit      PMH:   has a past medical history of ASCVD (arteriosclerotic cardiovascular disease), Depression, Diabetes mellitus without complication (HCC), Essential tremor, Hypertension, Migraines, PTSD (post-traumatic stress disorder), Sleep apnea, and Stroke (HCC).  PSH:    Past Surgical History:  Procedure Laterality Date   CORONARY ANGIOPLASTY WITH STENT PLACEMENT     x 2 stent   HERNIA REPAIR      Current Outpatient Medications  Medication Sig Dispense Refill   ALPRAZolam (XANAX) 0.25 MG tablet Take 0.25 mg by mouth at bedtime as needed for anxiety. (Patient not taking: Reported on 11/04/2023)      amitriptyline (ELAVIL) 25 MG tablet Take 25 mg by mouth at bedtime. (Patient not taking: Reported on 11/04/2023)     amLODipine  (NORVASC ) 10 MG tablet Take 1 tablet (10 mg total) by mouth daily. 90 tablet 3   aspirin  EC 81 MG tablet Take 1 tablet by mouth 1 day or 1 dose.     citalopram (CELEXA) 40 MG tablet Take 1 tablet by mouth 1 day or 1 dose.     clopidogrel (PLAVIX) 75 MG tablet Take 1 tablet by mouth 1 day or 1 dose. (Patient not taking: Reported on 11/04/2023)     ezetimibe  (ZETIA ) 10 MG tablet Take 1 tablet (10 mg total) by mouth daily. 90 tablet 3   glimepiride (AMARYL) 2 MG tablet Take 1 tablet by mouth daily with breakfast.     isosorbide dinitrate (ISORDIL) 30 MG tablet Take 60 mg by mouth at bedtime.     pantoprazole (PROTONIX) 40 MG tablet Take 1 tablet by mouth 1 day or 1 dose.     primidone (MYSOLINE) 50 MG tablet Take 50 mg by mouth 2 (two) times daily. (Patient not taking: Reported on 11/04/2023)     propranolol (INDERAL) 60 MG tablet Take 60 mg by mouth daily in the afternoon. (Patient not taking: Reported on 11/04/2023)     ziprasidone (GEODON) 40 MG capsule Take 40 mg by mouth 2 (two) times daily with a meal.     No current facility-administered medications for this visit.     Allergies:   Patient has no known allergies.   Social History:  The patient  reports that he has quit smoking. His smoking use included cigarettes.  He has a 50 pack-year smoking history. He has never used smokeless tobacco. He reports that he does not drink alcohol and does not use drugs.   Family History:   family history includes Cancer in his maternal uncle; Cancer - Other in his maternal uncle; Cervical cancer in his maternal aunt; Heart disease in his father; Heart failure in his father; Lung cancer in his maternal uncle.    Review of Systems: Review of Systems  Constitutional: Negative.   HENT: Negative.    Respiratory: Negative.    Cardiovascular: Negative.   Gastrointestinal:  Negative.   Musculoskeletal: Negative.   Neurological: Negative.   Psychiatric/Behavioral: Negative.    All other systems reviewed and are negative.    PHYSICAL EXAM: VS:  There were no vitals taken for this visit. , BMI There is no height or weight on file to calculate BMI. GEN: Well nourished, well developed, in no acute distress HEENT: normal Neck: no JVD, carotid bruits, or masses Cardiac: RRR; no murmurs, rubs, or gallops,no edema  Respiratory:  clear to auscultation bilaterally, normal work of breathing GI: soft, nontender, nondistended, + BS MS: no deformity or atrophy Skin: warm and dry, no rash Neuro:  Strength and sensation are intact Psych: euthymic mood, full affect   Recent Labs: No results found for requested labs within last 365 days.    Lipid Panel No results found for: CHOL, HDL, LDLCALC, TRIG    Wt Readings from Last 3 Encounters:  11/04/23 185 lb 2 oz (84 kg)  01/24/18 197 lb 12.8 oz (89.7 kg)  05/17/17 205 lb 4 oz (93.1 kg)      ASSESSMENT AND PLAN:  Problem List Items Addressed This Visit   None   Coronary artery disease with stable angina Heavy three-vessel coronary calcification on CT scan from 2019 Recent episodes of chest pain concerning for angina Not a good candidate for cardiac CTA given prior stenting over 10 years ago Recommend pharmacologic Myoview  to rule out ischemia Continue aspirin , add Zetia  10 mg daily as he prefers not to be on a statin  Smoker We have encouraged him to continue to work on weaning his cigarettes and smoking cessation. He will continue to work on this and does not want any assistance with chantix.    Hyperlipidemia Goal LDL less than 55 Given his body ache he prefers not to be on a statin Recommend he start Zetia  10 mg daily May need to try PCSK9 inhibitor if numbers continue to run high  Essential hypertension Blood pressure elevated with primary care, elevated today, he does not check pressures  at home Recommend he start amlodipine  5 mg daily for 2 weeks, for systolic pressure over 140 recommend he increase up to amlodipine  10    Signed, Velinda Lunger, M.D., Ph.D. [facility] [place], [place] [phone]

## 2024-11-17 ENCOUNTER — Ambulatory Visit: Payer: Self-pay | Attending: Cardiovascular Disease | Admitting: Cardiovascular Disease

## 2024-11-17 DIAGNOSIS — I25118 Atherosclerotic heart disease of native coronary artery with other forms of angina pectoris: Secondary | ICD-10-CM

## 2024-11-17 DIAGNOSIS — E119 Type 2 diabetes mellitus without complications: Secondary | ICD-10-CM

## 2024-11-17 DIAGNOSIS — I10 Essential (primary) hypertension: Secondary | ICD-10-CM
